# Patient Record
Sex: Male | Born: 1990
Health system: Southern US, Community
[De-identification: ages and names within clinical notes are randomized; demographics above are authoritative.]

## PROBLEM LIST (undated history)

## (undated) DIAGNOSIS — B2790 Infectious mononucleosis, unspecified without complication: Secondary | ICD-10-CM

## (undated) DIAGNOSIS — Z8709 Personal history of other diseases of the respiratory system: Secondary | ICD-10-CM

## (undated) DIAGNOSIS — K529 Noninfective gastroenteritis and colitis, unspecified: Secondary | ICD-10-CM

## (undated) DIAGNOSIS — J329 Chronic sinusitis, unspecified: Secondary | ICD-10-CM

## (undated) DIAGNOSIS — J302 Other seasonal allergic rhinitis: Secondary | ICD-10-CM

## (undated) HISTORY — DX: Noninfective gastroenteritis and colitis, unspecified: K52.9

## (undated) HISTORY — DX: Infectious mononucleosis, unspecified without complication: B27.90

## (undated) HISTORY — DX: Other seasonal allergic rhinitis: J30.2

## (undated) HISTORY — DX: Personal history of other diseases of the respiratory system: Z87.09

## (undated) HISTORY — DX: Chronic sinusitis, unspecified: J32.9

## (undated) HISTORY — PX: HERNIA REPAIR: SHX51

---
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
## 2011-02-03 ENCOUNTER — Ambulatory Visit (INDEPENDENT_AMBULATORY_CARE_PROVIDER_SITE_OTHER): Payer: PRIVATE HEALTH INSURANCE | Admitting: Family Medicine

## 2011-02-03 ENCOUNTER — Encounter: Payer: Self-pay | Admitting: Family Medicine

## 2011-02-03 DIAGNOSIS — M25569 Pain in unspecified knee: Secondary | ICD-10-CM

## 2011-02-03 DIAGNOSIS — Z23 Encounter for immunization: Secondary | ICD-10-CM

## 2011-02-03 DIAGNOSIS — J45909 Unspecified asthma, uncomplicated: Secondary | ICD-10-CM

## 2011-02-03 DIAGNOSIS — J301 Allergic rhinitis due to pollen: Secondary | ICD-10-CM

## 2011-02-11 NOTE — Assessment & Plan Note (Signed)
Summary: med refill [facility]   Vital Signs:  Patient profile:   20 year old male Weight:      135.25 pounds Temp:     98.2 degrees F oral Pulse rate:   60 / minute Pulse rhythm:   regular BP sitting:   118 / 72  (left arm) Cuff size:   regular  Vitals Entered By: Selena Batten Dance CMA Duncan Dull) (February 03, 2011 8:07 AM) CC: Med refill and Right knee pain Comments Wants 3 month suplly of Singulair x1 year for mail order   History of Present Illness: CC: med refil, R knee.  R knee injury - slammed in car door about 5 years ago, made popping sound.  recently at work and skating hit it into metal pole, swollen x 2 wks, pain continued.  Hurts on sides.  Knee pops, sometimes gives out.  No problems with left knee.    singulair to be refilled.  3 mo supply.  uses for asthma but helps with allergies as well.  notices difference when off singulair.  notices has more flares of asthma.  Current Medications (verified): 1)  Singulair 10 Mg Tabs (Montelukast Sodium) .Marland Kitchen.. 1 By Mouth Once Daily 2)  Claritin 10 Mg Tabs (Loratadine) .Marland Kitchen.. 1 By Mouth Once Daily 3)  Proventil Hfa 108 (90 Base) Mcg/act Aers (Albuterol Sulfate) .... As Directed As Needed 4)  Excedrin Migraine 250-250-65 Mg Tabs (Aspirin-Acetaminophen-Caffeine) .... As Needed  Allergies (verified): No Known Drug Allergies  Past History:  Past Medical History: Last updated: 11/26/2010 seasonal allergies recurrent sinusitis asthma  Social History: Last updated: 11/26/2010 No smoking, no EtOH, no rec drugs Lives with dad, sister, 2 cats, 1 dog [employer], 12th grade, unsure what he wants to do yet. Mostly A/Bs. with girlfriend but not sexually active  Review of Systems       per [facility]  Physical Exam  General:  Well appearing adolescent,no acute distress Head:  normocephalic and atraumatic  Eyes:  PERRL, EOMI Ears:  TM's pearly gray with normal light reflex and landmarks, canals clear  Nose:  Clear without  Rhinorrhea Mouth:  Clear without erythema, edema or exudate, mucous membranes moist Neck:  supple without adenopathy  Lungs:  Clear to ausc, no crackles, rhonchi or wheezing, no grunting, flaring or retractions  Heart:  RRR without murmur  Msk:  L knee nontender, no deformity. R knee tender to palpation medial joint line and posterior medial, mild crepitus, no deformity or erythema.  neg drawer sign, no PFgrind, FROM flexion/extension, neg McMurray's Pulses:  2+ rad pulses   Impression & Recommendations:  Problem # 1:  ASTHMA (ICD-493.90) refilled singulair x 1 year, mail order so printed out scripts.  His updated medication list for this problem includes:    Singulair 10 Mg Tabs (Montelukast sodium) .Marland Kitchen... 1 by mouth once daily    Proventil Hfa 108 (90 Base) Mcg/act Aers (Albuterol sulfate) .Marland Kitchen... As directed as needed  Problem # 2:  ALLERGIC RHINITIS, SEASONAL (ICD-477.0) see above  Problem # 3:  KNEE PAIN, RIGHT (ICD-719.46) Assessment: New anticipate pes anserine bursitis.  treat with ice, NSAIDs, compression (rec neoprene sleeve), and provided with stretching exercises from [facility] patient advisor.  if not better, return in 3-4 wks for f/u, get imaging, consider PT vs [facility] referral.  His updated medication list for this problem includes:    Excedrin Migraine 250-250-65 Mg Tabs (Aspirin-acetaminophen-caffeine) .Marland Kitchen... As needed    Naprosyn 500 Mg Tabs (Naproxen) .Marland Kitchen... Take one twice daily with food  for 7 days then as needed  Problem # 4:  Preventive Health Care (ICD-V70.0) meningitis shot today, due for 2nd varicella then will be UTD.  Complete Medication List: 1)  Singulair 10 Mg Tabs (Montelukast sodium) .Marland Kitchen.. 1 by mouth once daily 2)  Claritin 10 Mg Tabs (Loratadine) .Marland Kitchen.. 1 by mouth once daily 3)  Proventil Hfa 108 (90 Base) Mcg/act Aers (Albuterol sulfate) .... As directed as needed 4)  Excedrin Migraine 250-250-65 Mg Tabs (Aspirin-acetaminophen-caffeine) .... As needed 5)   Naprosyn 500 Mg Tabs (Naproxen) .... Take one twice daily with food for 7 days then as needed  Other Orders: Meningococcal Vaccine Comfort (16109) Admin 1st Vaccine (60454)  Patient Instructions: 1)  ice to knee daily for no more than 15 min at a time. 2)  use anti inflammatory prescribed, with food.  take daily for 5-7 days then as needed. 3)  stretching exercises provided. 4)  knee brace for extra support.  5)  If not better with these measures, please return to be seen again in 3-4 wks. 6)  call us with questions. 7)  Meningitis shot given today. Prescriptions: NAPROSYN 500 MG TABS (NAPROXEN) take one twice daily with food for 7 days then as needed  #45 x 0   Entered and Authorized by:   Eustaquio Boyden  MD   Signed by:   Eustaquio Boyden  MD on 02/03/2011   Method used:   Electronically to        [facility] [phone]* (retail)       [address] [place], [place]  [address]       Ph: [phone]       Fax: [phone]   RxID:   [id number] SINGULAIR 10 MG TABS (MONTELUKAST SODIUM) 1 by mouth once daily  #90 x 3   Entered and Authorized by:   Eustaquio Boyden  MD   Signed by:   Eustaquio Boyden  MD on 02/03/2011   Method used:   Print then Give to Patient   RxID:   [id number] SINGULAIR 10 MG TABS (MONTELUKAST SODIUM) 1 by mouth once daily  #90 x 3   Entered and Authorized by:   Eustaquio Boyden  MD   Signed by:   Eustaquio Boyden  MD on 02/03/2011   Method used:   Electronically to        [facility] [phone]* (retail)       [address] [place], [place]  [address]       Ph: [phone]       Fax: [phone]   RxID:   [id number]    Orders Added: 1)  Meningococcal Vaccine Veyo [90733] 2)  Admin 1st Vaccine [90471] 3)  Est. Patient Level III [62831]   Immunizations Administered:  Meningococcal Vaccine:    Vaccine Type: Meningococcal    Site: left deltoid    Mfr: Sanofi Pasteur     Dose: 0.5 ml    Route: IM    Given by: Selena Batten Dance CMA (AAMA)    Exp. Date: 12/12/2011    Lot #: [id number]    VIS given: 12/21/06 version given February 03, 2011.   Immunizations Administered:  Meningococcal Vaccine:    Vaccine Type: Meningococcal    Site: left deltoid    Mfr: [employer]  Dose: 0.5 ml    Route: IM    Given by: Janee Morn CMA (AAMA)    Exp. Date: 12/12/2011    Lot #: [id number]    VIS given: 12/21/06 version given February 03, 2011.  Current Allergies (reviewed today): No known allergies

## 2011-04-07 ENCOUNTER — Encounter: Payer: Self-pay | Admitting: Family Medicine

## 2011-04-07 ENCOUNTER — Ambulatory Visit (INDEPENDENT_AMBULATORY_CARE_PROVIDER_SITE_OTHER): Payer: PRIVATE HEALTH INSURANCE | Admitting: Family Medicine

## 2011-04-07 ENCOUNTER — Encounter: Payer: Self-pay | Admitting: *Deleted

## 2011-04-07 VITALS — BP 122/76 | HR 68 | Temp 98.7°F | Wt 135.0 lb

## 2011-04-07 DIAGNOSIS — R5381 Other malaise: Secondary | ICD-10-CM

## 2011-04-07 DIAGNOSIS — R634 Abnormal weight loss: Secondary | ICD-10-CM | POA: Insufficient documentation

## 2011-04-07 DIAGNOSIS — R1031 Right lower quadrant pain: Secondary | ICD-10-CM

## 2011-04-07 DIAGNOSIS — R5383 Other fatigue: Secondary | ICD-10-CM | POA: Insufficient documentation

## 2011-04-07 NOTE — Assessment & Plan Note (Addendum)
Check basic blood work.  Discussed healthy eating and sleeping, discussed goal / healthy day. Check mono screen given recent viral infection with sore throat.

## 2011-04-07 NOTE — Assessment & Plan Note (Signed)
See above.  On our scales, stable since 10/2010.   Discussed healthy eating.  Some shotty inguinal LAD.  Check basic blood work to r/o fatigue or other cause of weight loss. If not improving, further evaluation.

## 2011-04-07 NOTE — Progress Notes (Addendum)
  Subjective:    Patient ID: Shane Garza, male    DOB: February 16, 1991, 20 y.o.   MRN: [id number]  HPI CC: fatigue  Trayven presents today to discuss several things.  Goes to sleep tired, fatigued, wakes up the same way.  Goes to bed around 10pm, falls asleep around 10:30 to 11pm.  Wakes up around 5:45am.  Also h/o sleep walking.  Noticing losing weight. Usually size 30, currently using size 28 and has to tighten belt.  Decreased energy.  Also found 2 ticks on him last week.  More stressed than usual.  Graduation, parents, gets aggravated.  Work going good.  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  Sometimes has pain RLQ radiating to groin.  Off and on for last several months, this episode going on for 2 wks.  No flank pain.  Not trying anything for pain.  Pain worse with rolling to right.  ? If hernia.  H/o hernia repair on L (as 20yo).  Appetite ok.  Mild anhedonia.  No SI/HI.  No sick contacts at home.  No recent travel outside of country.  No fever/chills.  No nausea/vomiting/diarrhea.  No chest pain, SOB.  Currently with ST.  No h/o mono.  Thinks may be getting congested - pressure in head.  Denies sexual activity, dysuria, urgency, urethral discharge.  Gave blood 1 mo ago.  No recent blood work.  Worried about weight loss, states other ppl have brought it up.  However at our scales stable weight. Wt Readings from Last 3 Encounters:  04/07/11 135 lb (61.236 kg) (24.29%)  02/03/11 135 lb 4 oz (61.349 kg) (25.85%)  11/13/10 143 lb 4 oz (64.978 kg) (41.37%)   Review of Systems Per HPI    Objective:   Physical Exam  Nursing note and vitals reviewed. Constitutional: He appears well-developed and well-nourished. No distress.  HENT:  Head: Normocephalic and atraumatic.  Right Ear: External ear normal.  Left Ear: External ear normal.  Mouth/Throat: Oropharynx is clear and moist. No oropharyngeal exudate.  Eyes: Conjunctivae and EOM are normal. Pupils are equal, round, and  reactive to light. No scleral icterus.  Neck: Normal range of motion. Neck supple. No thyromegaly present.  Cardiovascular: Normal rate, regular rhythm, normal heart sounds and intact distal pulses.   No murmur heard. Pulmonary/Chest: Effort normal and breath sounds normal. No respiratory distress. He has no wheezes. He has no rales.  Abdominal: Soft. Bowel sounds are normal. He exhibits no distension and no mass. There is tenderness (mild RLQ tenderness, longstanding). There is no rebound and no guarding. Hernia confirmed negative in the right inguinal area (tender to palpation at entrance to inguinal ring, tender with cough but no hernia appreciated) and confirmed negative in the left inguinal area.  Genitourinary: Testes normal and penis normal. Right testis shows no mass, no swelling and no tenderness. Right testis is descended. Left testis shows no mass, no swelling and no tenderness. Left testis is descended.       bilateral shotty inguinal LAD. No significant appreciable inguinal hernias, + tender on R with testing hernia.  Lymphadenopathy:    He has no cervical adenopathy.  Skin: Skin is warm and dry. No rash noted.  Psychiatric: He has a normal mood and affect.          Assessment & Plan:

## 2011-04-07 NOTE — Assessment & Plan Note (Signed)
Unclear etiology, longstanding, for >6 mo. Duration not consistent with appendiceal inflammation. ?inguinal hernia on R as story consistent with this, although did not appreciate on exam today.  s/p repair on L as child.   If not resolving, likely referral to surgery for evaluation.

## 2011-04-07 NOTE — Patient Instructions (Signed)
For weight - we will keep watching this.  Return in 2-3 months for follow up, sooner if feeling worse.  Ensure getting 3 healthy meals a day.  Goal is 7-8 hours of sleep/day. For abdominal pain, we will check some blood work today.  We will call you with results.  If continued, may send you to surgery for evaluation. Good to see you today, call us with questions.

## 2011-04-08 LAB — CBC WITH DIFFERENTIAL/PLATELET
Basophils Absolute: 0 10*3/uL (ref 0.0–0.1)
Basophils Relative: 0.3 % (ref 0.0–3.0)
Eosinophils Absolute: 0.1 10*3/uL (ref 0.0–0.7)
Eosinophils Relative: 1.5 % (ref 0.0–5.0)
HCT: 45.2 % (ref 39.0–52.0)
Hemoglobin: 15.6 g/dL (ref 13.0–17.0)
Lymphocytes Relative: 26.3 % (ref 12.0–46.0)
Lymphs Abs: 1.7 10*3/uL (ref 0.7–4.0)
MCHC: 34.5 g/dL (ref 30.0–36.0)
MCV: 94.6 fl (ref 78.0–100.0)
Monocytes Absolute: 0.5 10*3/uL (ref 0.1–1.0)
Monocytes Relative: 8.5 % (ref 3.0–12.0)
Neutro Abs: 4 10*3/uL (ref 1.4–7.7)
Neutrophils Relative %: 63.4 % (ref 43.0–77.0)
Platelets: 261 10*3/uL (ref 150.0–400.0)
RBC: 4.78 Mil/uL (ref 4.22–5.81)
RDW: 13.2 % (ref 11.5–14.6)
WBC: 6.4 10*3/uL (ref 4.5–10.5)

## 2011-04-08 LAB — COMPREHENSIVE METABOLIC PANEL
ALT: 15 U/L (ref 0–53)
AST: 16 U/L (ref 0–37)
Albumin: 4.9 g/dL (ref 3.5–5.2)
Alkaline Phosphatase: 90 U/L (ref 39–117)
BUN: 12 mg/dL (ref 6–23)
CO2: 26 mEq/L (ref 19–32)
Calcium: 10 mg/dL (ref 8.4–10.5)
Chloride: 104 mEq/L (ref 96–112)
Creatinine, Ser: 0.9 mg/dL (ref 0.4–1.5)
GFR: 122.51 mL/min (ref >60.00)
Glucose, Bld: 61 mg/dL — ABNORMAL LOW (ref 70–99)
Potassium: 4.7 mEq/L (ref 3.5–5.1)
Sodium: 142 mEq/L (ref 135–145)
Total Bilirubin: 0.7 mg/dL (ref 0.3–1.2)
Total Protein: 7.9 g/dL (ref 6.0–8.3)

## 2011-04-08 LAB — MONONUCLEOSIS SCREEN: Mono Screen: NEGATIVE

## 2011-04-08 LAB — TSH: TSH: 1.83 u[IU]/mL (ref 0.35–5.50)

## 2011-07-02 ENCOUNTER — Encounter: Payer: Self-pay | Admitting: Family Medicine

## 2011-07-02 ENCOUNTER — Ambulatory Visit (INDEPENDENT_AMBULATORY_CARE_PROVIDER_SITE_OTHER): Payer: PRIVATE HEALTH INSURANCE | Admitting: Family Medicine

## 2011-07-02 VITALS — BP 118/70 | HR 60 | Temp 97.9°F | Ht 64.25 in | Wt 135.2 lb

## 2011-07-02 DIAGNOSIS — J45909 Unspecified asthma, uncomplicated: Secondary | ICD-10-CM

## 2011-07-02 DIAGNOSIS — Z0000 Encounter for general adult medical examination without abnormal findings: Secondary | ICD-10-CM | POA: Insufficient documentation

## 2011-07-02 DIAGNOSIS — J301 Allergic rhinitis due to pollen: Secondary | ICD-10-CM

## 2011-07-02 DIAGNOSIS — R634 Abnormal weight loss: Secondary | ICD-10-CM

## 2011-07-02 DIAGNOSIS — Z020 Encounter for examination for admission to educational institution: Secondary | ICD-10-CM

## 2011-07-02 DIAGNOSIS — Z23 Encounter for immunization: Secondary | ICD-10-CM

## 2011-07-02 DIAGNOSIS — Z0289 Encounter for other administrative examinations: Secondary | ICD-10-CM

## 2011-07-02 LAB — POCT URINALYSIS DIPSTICK
Bilirubin, UA: NEGATIVE
Blood, UA: NEGATIVE
Glucose, UA: NEGATIVE
Ketones, UA: NEGATIVE
Leukocytes, UA: NEGATIVE
Nitrite, UA: NEGATIVE
Protein, UA: NEGATIVE
Spec Grav, UA: 1.025
Urobilinogen, UA: 0.2
pH, UA: 6

## 2011-07-02 MED ORDER — TUBERCULIN PPD 5 UNIT/0.1ML ID SOLN: 5.0000 [IU] | Freq: Once | INTRADERMAL | Status: DC

## 2011-07-02 NOTE — Assessment & Plan Note (Signed)
Stable on singulair and claritin.  

## 2011-07-02 NOTE — Patient Instructions (Signed)
Tdap today as well as 2nd varicella. PPD today - return Friday with form to finish filling out. Good luck at school! Keep home and car smoke-free Stay physically active (>30-60 minutes 3 times a day) Maximum 1-2 hours of TV & computer a day Wear seatbelts, ensure passengers do too Drive responsibly when you get your license Avoid alcohol, smoking, drug use Ride with designated driver or call for a ride if drinking Abstinence from sex is the best way to avoid pregnancy and STDs Limit sun, use sunscreen Seek help if you feel angry, depressed, or sad often 3 meals a day and healthy snacks Brush teeth twice a day Participate in social activities, sports, community groups Maintain strong family relationships Follow up in 1 year or as needed

## 2011-07-02 NOTE — Progress Notes (Addendum)
Subjective:    Patient ID: Shane Garza, male    DOB: 07-25-1991, 20 y.o.   MRN: [id number]  HPI CC: CPE  ?poison ivy - right forearm.  Was into some insulation over weekend.  Also mowing yard.  Using benadryl anti itch gel.  Running daily, going to gym. 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 Passed agility and physical stamina last week. Sunscreen, 100% seatbelt. Healthy diet - fruits, vegetables, water.  No multivitamin. Good relations with family, friends. Not sexually active.  H/o asthma, mild intermittent, no recent flares. H/o allergies well controlled on singulair and claritin.  Medications and allergies reviewed and updated in chart. Patient Active Problem List  Diagnoses  . ALLERGIC RHINITIS, SEASONAL  . ASTHMA  . HEADACHE  . RLQ abdominal pain  . Fatigue  . Weight loss, non-intentional   Past Medical History  Diagnosis Date  . Allergy   . Recurrent sinusitis   . Asthma     mild intermittent, no recent flares   Past Surgical History  Procedure Date  . Hernia repair    History  Substance Use Topics  . Smoking status: Never Smoker   . Smokeless tobacco: Not on file  . Alcohol Use: No   Family History  Problem Relation Age of Onset  . Hypertension Father   . Diabetes Paternal Grandfather    No Known Allergies Current Outpatient Prescriptions on File Prior to Visit  Medication Sig Dispense Refill  . albuterol (PROVENTIL HFA) 108 (90 BASE) MCG/ACT inhaler Inhale 2 puffs into the lungs every 6 (six) hours as needed.        Marland Kitchen aspirin-acetaminophen-caffeine (EXCEDRIN MIGRAINE) 250-250-65 MG per tablet Take 1 tablet by mouth every 6 (six) hours as needed.        . loratadine (CLARITIN) 10 MG tablet Take 10 mg by mouth daily.        . montelukast (SINGULAIR) 10 MG tablet Take 10 mg by mouth at bedtime.        . naproxen (NAPROSYN) 500 MG tablet Take 500 mg by mouth 2 (two) times daily with a meal. For 7 days only         Review of Systems  Constitutional: Negative for fever, chills, activity change, appetite change, fatigue and unexpected weight change.  HENT: Negative for hearing loss and neck pain.   Eyes: Negative for visual disturbance.  Respiratory: Negative for cough, choking, chest tightness, shortness of breath and wheezing.   Cardiovascular: Negative for chest pain, palpitations and leg swelling.  Gastrointestinal: Negative for nausea, vomiting, abdominal pain, diarrhea, constipation, blood in stool and abdominal distention.  Genitourinary: Negative for hematuria and difficulty urinating.  Musculoskeletal: Negative for myalgias and arthralgias.  Skin: Negative for rash.  Neurological: Negative for dizziness, seizures, syncope and headaches.  Hematological: Does not bruise/bleed easily.  Psychiatric/Behavioral: Negative for dysphoric mood. The patient is not nervous/anxious.        Objective:   Physical Exam  Nursing note and vitals reviewed. Constitutional: He is oriented to person, place, and time. He appears well-developed and well-nourished. No distress.  HENT:  Head: Normocephalic and atraumatic.  Right Ear: External ear normal.  Left Ear: External ear normal.  Nose: Nose normal.  Mouth/Throat: Oropharynx is clear and moist.  Eyes: Conjunctivae and EOM are normal. Pupils are equal, round, and reactive to light.  Neck: Normal range of motion. Neck supple. No thyromegaly present.  Cardiovascular: Normal rate, regular rhythm, normal heart sounds and intact distal pulses.  No murmur heard. Pulses:      Radial pulses are 2+ on the right side, and 2+ on the left side.  Pulmonary/Chest: Effort normal and breath sounds normal. No respiratory distress. He has no wheezes. He has no rales.  Abdominal: Soft. Bowel sounds are normal. He exhibits no distension and no mass. There is no tenderness. There is no rebound and no guarding.  Musculoskeletal: Normal range of motion.       Right  shoulder: Normal.       Left shoulder: Normal.       Right elbow: Normal.      Left elbow: Normal.       Right hip: Normal.       Left hip: Normal.       Right knee: Normal.       Left knee: Normal.       Right ankle: Normal.       Left ankle: Normal.  Lymphadenopathy:    He has no cervical adenopathy.  Neurological: He is alert and oriented to person, place, and time. He has normal strength. No sensory deficit.  Reflex Scores:      Bicep reflexes are 1+ on the right side and 1+ on the left side.      Brachioradialis reflexes are 1+ on the right side and 1+ on the left side.      Patellar reflexes are 2+ on the right side and 2+ on the left side.      Achilles reflexes are 2+ on the right side and 2+ on the left side.      CN grossly intact, station and gait intact  Skin: Skin is warm and dry. Rash (L forearm with mild erythematous papular rash with excoriation) noted.  Psychiatric: He has a normal mood and affect. His behavior is normal. Judgment and thought content normal.          Assessment & Plan:

## 2011-07-02 NOTE — Assessment & Plan Note (Signed)
Minimal sxs, no recent need for albuterol

## 2011-07-02 NOTE — Assessment & Plan Note (Addendum)
Reviewed and filled out forms. Never had chicken pox.  Only 1st varicella in records. Provided with Tdap, 2nd varicella today. PPD placed today. Discussed healthy living, healthy diet. For forearm rash - likely poison ivy, continue benadryl cream and may use OTC HC.  Update if spreading.

## 2011-07-02 NOTE — Assessment & Plan Note (Signed)
Wt Readings from Last 3 Encounters:  07/02/11 135 lb 4 oz (61.349 kg) (23.25%)  04/07/11 135 lb (61.236 kg) (24.29%)  02/03/11 135 lb 4 oz (61.349 kg) (25.85%)

## 2011-07-04 ENCOUNTER — Ambulatory Visit: Payer: PRIVATE HEALTH INSURANCE

## 2011-08-25 ENCOUNTER — Telehealth: Payer: Self-pay | Admitting: *Deleted

## 2011-08-25 NOTE — Telephone Encounter (Signed)
Pt complains of vomiting this morning.  No fever, no diarrhea. Advised him to avoid solid foods, drink small frequent sips of clear fluids, ice chips.  Let us know if he develops severe abd pain or fever.

## 2011-08-25 NOTE — Telephone Encounter (Signed)
Patients father notified.

## 2011-08-25 NOTE — Telephone Encounter (Signed)
Agree. Thanks.  Please ensure no blood in emesis and ask if still taking naprosyn, if so to stop.

## 2011-08-25 NOTE — Telephone Encounter (Signed)
Message left advising patient to return my call and advise if there was any bloody emesis. Advised to stop taking naprosyn if he was still taking it. Will await return call.

## 2011-09-02 ENCOUNTER — Ambulatory Visit (INDEPENDENT_AMBULATORY_CARE_PROVIDER_SITE_OTHER): Payer: PRIVATE HEALTH INSURANCE | Admitting: Family Medicine

## 2011-09-02 ENCOUNTER — Encounter: Payer: Self-pay | Admitting: Family Medicine

## 2011-09-02 VITALS — BP 98/60 | HR 68 | Temp 98.6°F | Wt 132.5 lb

## 2011-09-02 DIAGNOSIS — J45901 Unspecified asthma with (acute) exacerbation: Secondary | ICD-10-CM

## 2011-09-02 DIAGNOSIS — J069 Acute upper respiratory infection, unspecified: Secondary | ICD-10-CM

## 2011-09-02 MED ORDER — AZITHROMYCIN 250 MG PO TABS: ORAL_TABLET | ORAL | Status: AC

## 2011-09-02 NOTE — Patient Instructions (Signed)
Take all antibiotics  Can use albuterol 2 puffs up to every 4 hours if chest is tight, wheezing, or you feel short of breath.  Any kind of cough medicine is fine.

## 2011-09-02 NOTE — Progress Notes (Signed)
  Subjective:    Patient ID: Shane Garza, male    DOB: 1991-01-10, 20 y.o.   MRN: [id number]  HPI  Shane Garza, a 20 y.o. male presents today in the office for the following:    A couple of days ago - has had some congestion. Yesterday, was sitting in class, felt flushed maybe feverish. Got home and felt pretty awful. Fever has broken. Crashed in the bed until this morning. Chest has been hurting.  Some trouble wheezing.   H/o asthma  The PMH, PSH, Social History, Family History, Medications, and allergies have been reviewed in [facility], and have been updated if relevant.   Review of Systems ROS: GEN: Acute illness details above GI: Tolerating PO intake GU: maintaining adequate hydration and urination Pulm: No SOB Interactive and getting along well at home.  Otherwise, ROS is as per the HPI.     Objective:   Physical Exam   Physical Exam  Blood pressure 98/60, pulse 68, temperature 98.6 F (37 C), temperature source Oral, weight 132 lb 8 oz (60.102 kg), SpO2 100.00%.  GEN: A and O x 3. WDWN. NAD.    ENT: Nose clear, ext NML.  No LAD.  No JVD.  TM's clear. Oropharynx clear.  PULM: Normal WOB, no distress. No crackles, wheezes, rhonchi. Mild decreased bs CV: RRR, no M/G/R, No rubs, No JVD.   ABD: S, NT, ND, + BS. No rebound. No guarding. No HSM.   EXT: warm and well-perfused, No c/c/e. PSYCH: Pleasant and conversant.       Assessment & Plan:   1. Asthma exacerbation  azithromycin (ZITHROMAX Z-PAK) 250 MG tablet  2. URI (upper respiratory infection)      Stable, mild asthma exac. Albuterol q 4 hours prn and zpak

## 2011-09-03 ENCOUNTER — Other Ambulatory Visit: Payer: Self-pay | Admitting: *Deleted

## 2011-09-03 MED ORDER — ALBUTEROL SULFATE (2.5 MG/3ML) 0.083% IN NEBU: 2.5000 mg | INHALATION_SOLUTION | Freq: Four times a day (QID) | RESPIRATORY_TRACT | Status: DC | PRN

## 2011-10-24 ENCOUNTER — Telehealth: Payer: Self-pay

## 2011-10-24 NOTE — Telephone Encounter (Signed)
Message left notifying patient's father that patient was up to date on Hep B vaccine and could pick up a copy of that record anytime. I also advised that I would schedule him a flu vaccine next week. I scheduled vaccine for 10-30-11 at 8:30 and told him to call me back if that time was not convenient.

## 2011-10-24 NOTE — Telephone Encounter (Signed)
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 According to immunization record pt had Hep B 01[birth year]03[birth year]93,03[birth year]08[birth year]93 and 08[birth year]18/97. Does pt need Hep B and can test be scheduled? Mr Mccutchan can be reached at [phone].Please advise.

## 2011-10-24 NOTE — Telephone Encounter (Signed)
Done with Hep B.  May come in for flu shot. May provide with immunization record.

## 2011-10-30 ENCOUNTER — Ambulatory Visit (INDEPENDENT_AMBULATORY_CARE_PROVIDER_SITE_OTHER): Payer: PRIVATE HEALTH INSURANCE

## 2011-10-30 DIAGNOSIS — Z23 Encounter for immunization: Secondary | ICD-10-CM

## 2011-12-31 ENCOUNTER — Telehealth: Payer: Self-pay | Admitting: Family Medicine

## 2012-01-01 NOTE — Telephone Encounter (Signed)
Patient came in regarding vaccine record for school. Tried to get things taken care of for him. He will call with anymore problems.

## 2012-05-12 ENCOUNTER — Telehealth: Payer: Self-pay | Admitting: *Deleted

## 2012-05-12 NOTE — Telephone Encounter (Signed)
I'm not sure. He told Lyla Son it specifically said a PFT. I will leave appt as scheduled.

## 2012-05-12 NOTE — Telephone Encounter (Signed)
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 He specifically said they required a PFT. Can you refer him to pulmo for that or do you need to see him prior? He is on the schedule for tomorrow, but I can cancel the appt if you can just do the referral.

## 2012-05-12 NOTE — Telephone Encounter (Signed)
Ask him to bring in form to fill out.  I wonder if in office spirometry with pre/post albuterol will suffice.

## 2012-05-13 ENCOUNTER — Encounter: Payer: Self-pay | Admitting: Family Medicine

## 2012-05-13 ENCOUNTER — Ambulatory Visit (INDEPENDENT_AMBULATORY_CARE_PROVIDER_SITE_OTHER): Payer: PRIVATE HEALTH INSURANCE | Admitting: Family Medicine

## 2012-05-13 VITALS — BP 141/88 | HR 81 | Temp 98.4°F | Wt 139.0 lb

## 2012-05-13 DIAGNOSIS — R030 Elevated blood-pressure reading, without diagnosis of hypertension: Secondary | ICD-10-CM

## 2012-05-13 DIAGNOSIS — J45909 Unspecified asthma, uncomplicated: Secondary | ICD-10-CM

## 2012-05-13 MED ORDER — ALBUTEROL SULFATE (2.5 MG/3ML) 0.083% IN NEBU
2.5000 mg | INHALATION_SOLUTION | Freq: Once | RESPIRATORY_TRACT | Status: AC
  Administered 2012-05-13: 2.5 mg via RESPIRATORY_TRACT

## 2012-05-13 NOTE — Progress Notes (Signed)
Subjective:    Patient ID: Shane Garza, male    DOB: 09-02-1991, 21 y.o.   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  Long hx childhood asthma, but never a problem.  No flares in years.  Hasn't used albuterol inhaler in 4 years, nebulizer either.  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  No h/o HTN in past. Actually always very well controlled. Endorses recently taking NSAIDs, high salt diet, and using smokeless tobacco. BP Readings from Last 3 Encounters:  05/13/12 141/88  09/02/11 98/60  07/02/11 118/70     Medications and allergies reviewed and updated in chart.  Past histories reviewed and updated if relevant as below. Patient Active Problem List  Diagnosis  . ALLERGIC RHINITIS, SEASONAL  . ASTHMA  . HEADACHE  . RLQ abdominal pain  . Fatigue  . Healthcare maintenance   Past Medical History  Diagnosis Date  . Allergy   . Recurrent sinusitis   . Asthma     childhood   Past Surgical History  Procedure Date  . Hernia repair    History  Substance Use Topics  . Smoking status: Former Smoker    Types: Cigarettes  . Smokeless tobacco: Current User    Types: Snuff, Chew  . Alcohol Use: Yes   Family History  Problem Relation Age of Onset  . Hypertension Father   . Diabetes Paternal Grandfather    No Known Allergies Current Outpatient Prescriptions on File Prior to Visit  Medication Sig Dispense Refill  . aspirin-acetaminophen-caffeine (EXCEDRIN MIGRAINE) 250-250-65 MG per tablet Take 1 tablet by mouth every 6 (six) hours as needed.        . loratadine (CLARITIN) 10 MG tablet Take 10 mg by mouth daily.        Marland Kitchen albuterol (PROVENTIL HFA) 108 (90 BASE) MCG/ACT inhaler Inhale 2 puffs into the lungs every 6 (six) hours as needed.        . montelukast (SINGULAIR) 10 MG tablet Take 10 mg by mouth at  bedtime.        Marland Kitchen DISCONTD: albuterol (PROVENTIL) (2.5 MG/3ML) 0.083% nebulizer solution Take 3 mLs (2.5 mg total) by nebulization every 6 (six) hours as needed for wheezing.  75 mL  12   Current Facility-Administered Medications on File Prior to Visit  Medication Dose Route Frequency Provider Last Rate Last Dose  . DISCONTD: tuberculin injection 5 Units  5 Units Intradermal Once Eustaquio Boyden, MD         Review of Systems Per HPI    Objective:   Physical Exam  Nursing note and vitals reviewed. Constitutional: He appears well-developed and well-nourished. No distress.  HENT:  Head: Normocephalic and atraumatic.  Mouth/Throat: Oropharynx is clear and moist. No oropharyngeal exudate.  Eyes: Conjunctivae and EOM are normal. Pupils are equal, round, and reactive to light. No scleral icterus.  Neck: Normal range of motion. Neck supple.  Cardiovascular: Normal rate, regular rhythm, normal heart sounds and intact distal pulses.   No murmur heard. Pulmonary/Chest: Effort normal and breath sounds normal. No respiratory distress. He has no wheezes. He has no rales.       Good air movement, no wheeze   Musculoskeletal: He exhibits no edema.  Lymphadenopathy:    He has no cervical adenopathy.  Skin: Skin is warm and dry. No rash noted.  Psychiatric: He  has a normal mood and affect.       Assessment & Plan:

## 2012-05-13 NOTE — Patient Instructions (Addendum)
Breathing test today - overall normal.  childhood asthma should not cause any problems now. Blood pressure staying a bit elevated - return in 1-2 weeks for doctor visit to recheck.  Avoid anti inflammatories, salt, tobacco.

## 2012-05-13 NOTE — Assessment & Plan Note (Signed)
First elevated reading.  Several reasons could be contributing Discussed avoid salt, tobacco, nsaids, return in 2 wks for recheck blood pressure not immediately after exercise.

## 2012-05-13 NOTE — Assessment & Plan Note (Addendum)
Spirometry today - WNL. No evidence of active asthma. Should not affect ability to enlist.

## 2012-06-03 ENCOUNTER — Ambulatory Visit: Payer: PRIVATE HEALTH INSURANCE | Admitting: Family Medicine

## 2012-06-03 DIAGNOSIS — Z0289 Encounter for other administrative examinations: Secondary | ICD-10-CM

## 2012-06-25 ENCOUNTER — Ambulatory Visit: Payer: PRIVATE HEALTH INSURANCE | Admitting: Family Medicine

## 2012-12-28 ENCOUNTER — Ambulatory Visit (INDEPENDENT_AMBULATORY_CARE_PROVIDER_SITE_OTHER): Payer: PRIVATE HEALTH INSURANCE | Admitting: Family Medicine

## 2012-12-28 ENCOUNTER — Encounter: Payer: Self-pay | Admitting: Family Medicine

## 2012-12-28 VITALS — BP 120/64 | HR 76 | Temp 98.2°F | Wt 151.5 lb

## 2012-12-28 DIAGNOSIS — Z23 Encounter for immunization: Secondary | ICD-10-CM

## 2012-12-28 DIAGNOSIS — J301 Allergic rhinitis due to pollen: Secondary | ICD-10-CM

## 2012-12-28 DIAGNOSIS — Z8709 Personal history of other diseases of the respiratory system: Secondary | ICD-10-CM

## 2012-12-28 NOTE — Patient Instructions (Addendum)
Pass by Marion's office for referral to pulmonologist for formal PFTs. Letter provided today. Let us know if questions. Flu shot today.

## 2012-12-28 NOTE — Progress Notes (Signed)
  Subjective:    Patient ID: Shane Garza, male    DOB: [birth year], 22 y.o.   MRN: [id number]  HPI CC: paperwork  Has been trying to get into military.  [employer] called him last week, stating he needs referral to pulm for formal PFTs given h/o childhood asthma.  Needs letter from PCP saying asthma is not currently a problem.    Would like copy of spirometry done 04/2012.  H/o childhood asthma - no flare since 2006.  Has not used albuterol since 2006.  Denies cough, dyspnea, wheezing.  Stays active and runs without any dyspnea endorsed. States has not used singulair regularly since 2008 although last prescribed 2012.  Working at [employer].  Not currently going to school.  Working full time.  Seasonal allergic rhinitis - has not regularly used spiriva in several years, states not using claritin.  Medications and allergies reviewed and updated in chart.  Past histories reviewed and updated if relevant as below. Patient Active Problem List  Diagnosis  . ALLERGIC RHINITIS, SEASONAL  . History of asthma  . HEADACHE  . RLQ abdominal pain  . Fatigue  . Healthcare maintenance  . Elevated blood pressure reading without diagnosis of hypertension   Past Medical History  Diagnosis Date  . Seasonal allergic rhinitis   . Recurrent sinusitis   . History of asthma     childhood   Past Surgical History  Procedure Date  . Hernia repair    History  Substance Use Topics  . Smoking status: Former Smoker    Types: Cigarettes    Quit date: 12/14/2012  . Smokeless tobacco: Current User    Types: Snuff, Chew  . Alcohol Use: Yes   Family History  Problem Relation Age of Onset  . Hypertension Father   . Diabetes Paternal Grandfather    No Known Allergies Current Outpatient Prescriptions on File Prior to Visit  Medication Sig Dispense Refill  . aspirin-acetaminophen-caffeine (EXCEDRIN MIGRAINE) 250-250-65 MG per tablet Take 1 tablet by mouth every 6 (six) hours  as needed.        Marland Kitchen albuterol (PROVENTIL HFA) 108 (90 BASE) MCG/ACT inhaler Inhale 2 puffs into the lungs every 6 (six) hours as needed.        . loratadine (CLARITIN) 10 MG tablet Take 10 mg by mouth daily.        . montelukast (SINGULAIR) 10 MG tablet Take 10 mg by mouth at bedtime.           Review of Systems Per HPI    Objective:   Physical Exam  Nursing note and vitals reviewed. Constitutional: He appears well-developed and well-nourished. No distress.  HENT:  Head: Normocephalic and atraumatic.  Mouth/Throat: Oropharynx is clear and moist. No oropharyngeal exudate.  Eyes: Conjunctivae normal and EOM are normal. Pupils are equal, round, and reactive to light.  Neck: Normal range of motion. Neck supple.  Cardiovascular: Normal rate, regular rhythm, normal heart sounds and intact distal pulses.   No murmur heard. Pulmonary/Chest: Effort normal and breath sounds normal. No respiratory distress. He has no wheezes. He has no rales.  Lymphadenopathy:    He has no cervical adenopathy.      Assessment & Plan:

## 2012-12-29 ENCOUNTER — Encounter: Payer: Self-pay | Admitting: Family Medicine

## 2012-12-29 NOTE — Assessment & Plan Note (Signed)
Will refer to pulm for eval per military request for clearance given h/o childhood asthma.

## 2012-12-29 NOTE — Assessment & Plan Note (Signed)
States has not used claritin or singulair in several years - will remove singulair from med list.

## 2013-01-03 ENCOUNTER — Telehealth: Payer: Self-pay | Admitting: Family Medicine

## 2013-01-03 DIAGNOSIS — J301 Allergic rhinitis due to pollen: Secondary | ICD-10-CM

## 2013-01-03 DIAGNOSIS — Z8709 Personal history of other diseases of the respiratory system: Secondary | ICD-10-CM

## 2013-01-03 NOTE — Telephone Encounter (Signed)
Placed order in chart. plz have him call Dr. Kendrick Fries to schedule this.

## 2013-01-03 NOTE — Telephone Encounter (Signed)
Pt says he needs PDF (pulmonary breathing test), but does not need an apptmt w/Dr. Kendrick Fries (phone #[phone]; fax # [phone]).  Dr. Ulyses Jarred office says he doesn't need an apptmt w/him, but only needs you to write an order for this test.  Can you write an order so Dr. Kendrick Fries can administer this test? Thank you.

## 2013-01-03 NOTE — Telephone Encounter (Signed)
Patient notified

## 2013-01-11 ENCOUNTER — Institutional Professional Consult (permissible substitution): Payer: PRIVATE HEALTH INSURANCE | Admitting: Pulmonary Disease

## 2013-01-12 ENCOUNTER — Telehealth: Payer: Self-pay

## 2013-01-12 NOTE — Telephone Encounter (Signed)
pts grandmother said pt signed for records on 12/28/12; was sent to [facility] on 01/04/13 and can take 7-10 business days to mail out info once request received. Pts grandmother will ck with [employer] if not received by next week.

## 2013-01-31 ENCOUNTER — Ambulatory Visit (INDEPENDENT_AMBULATORY_CARE_PROVIDER_SITE_OTHER): Payer: PRIVATE HEALTH INSURANCE | Admitting: Internal Medicine

## 2013-01-31 DIAGNOSIS — J301 Allergic rhinitis due to pollen: Secondary | ICD-10-CM

## 2013-01-31 DIAGNOSIS — Z8709 Personal history of other diseases of the respiratory system: Secondary | ICD-10-CM

## 2013-01-31 LAB — PULMONARY FUNCTION TEST

## 2013-01-31 NOTE — Progress Notes (Signed)
PFT done today. 

## 2013-02-04 ENCOUNTER — Telehealth: Payer: Self-pay | Admitting: Pulmonary Disease

## 2013-02-04 NOTE — Telephone Encounter (Signed)
Pt called back and he is aware that we will have to have Dr. Kendrick Fries review these results first.  Pt will have his grandmother call back leave her fax number for these results to be faxed to her.  Pt is aware that Dr. Kendrick Fries will be back in the office on 3/18.  Will hold in triage until grandmother calls back to give her fax number and then will forward to BQ.

## 2013-02-04 NOTE — Telephone Encounter (Signed)
Spoke with pt We did not order the PFT  Dr Sharen Hones ordered this and he will need to go over the results with the pt since he is not est with any of our providers Pt verbalized understanding and states nothing further needed

## 2013-02-04 NOTE — Telephone Encounter (Signed)
ATC patient no answer [id number] 

## 2013-02-04 NOTE — Telephone Encounter (Signed)
The fax number is [phone].  Will need to call prior to faxing so pt's grandmother can turn on the fax machine.  Shane Garza

## 2013-02-06 ENCOUNTER — Telehealth: Payer: Self-pay | Admitting: Family Medicine

## 2013-02-06 NOTE — Telephone Encounter (Signed)
plz notify we received copy of PFTs done by pulm - completely normal.  I've asked to scan into chart. --> asthma not currently an issue. 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

## 2013-02-07 NOTE — Telephone Encounter (Signed)
Faxed as requested after notifying patient's grandmother.

## 2013-02-07 NOTE — Telephone Encounter (Signed)
Message left for patient to return my call.  

## 2013-02-07 NOTE — Telephone Encounter (Signed)
pts grandmother Dale [place] said pt wants PFT faxed to pts grandmother's fax # [phone]. Dale [place] needs a call prior to faxing PFT so pt can turn fax on. Dale Clarkson can be reached at [phone].

## 2013-09-12 ENCOUNTER — Encounter: Payer: Self-pay | Admitting: Family Medicine

## 2013-09-12 ENCOUNTER — Ambulatory Visit (INDEPENDENT_AMBULATORY_CARE_PROVIDER_SITE_OTHER): Payer: PRIVATE HEALTH INSURANCE | Admitting: Family Medicine

## 2013-09-12 VITALS — BP 100/72 | HR 65 | Temp 98.2°F | Ht 64.25 in | Wt 160.2 lb

## 2013-09-12 DIAGNOSIS — M549 Dorsalgia, unspecified: Secondary | ICD-10-CM

## 2013-09-12 MED ORDER — PREDNISONE 20 MG PO TABS: ORAL_TABLET | ORAL | Status: DC

## 2013-09-12 MED ORDER — TIZANIDINE HCL 4 MG PO TABS: 4.0000 mg | ORAL_TABLET | Freq: Every evening | ORAL | Status: DC

## 2013-09-12 MED ORDER — TRAMADOL HCL 50 MG PO TABS: 50.0000 mg | ORAL_TABLET | Freq: Four times a day (QID) | ORAL | Status: DC | PRN

## 2013-09-12 NOTE — Patient Instructions (Signed)
F/u if not better in 4 weeks

## 2013-09-12 NOTE — Progress Notes (Signed)
Patient Name: Shane Garza Date of Birth: 18-Mar-1991 Medical Record Number: [id number] Gender: male  PCP: Eustaquio Boyden, MD  History of Present Illness:  Shane Garza is a 22 y.o. very pleasant male patient who presents with the following: Back Pain  ongoing for approximately: 2-3 days The patient has had back pain before. The back pain is localized into the lumbar spine area. They also describe no radiculopathy.  About 1 year ago, feell on his back at work and had bad back pain. Went to the chiropractor. Nothing like this before.   Going deer hunting over the weekend. On Sunday, it could not get comfortable. Tried to roll over some and got sharp pain at low back Commercial and industrial - crawling, ladders at work.   No numbness or tingling. No bowel or bladder incontinence. No focal weakness. Prior interventions: manipulation Physical therapy: No Acupuncture: No Osteopathic manipulation: No Heat or cold: Minimal effect  Past Medical History, Surgical History, Family History, Medications, Allergies have been reviewed and updated if relevant.  Review of Systems  GEN: No fevers, chills. Nontoxic. Primarily MSK c/o today. MSK: Detailed in the HPI GI: tolerating PO intake without difficulty Neuro: As above  Otherwise the pertinent positives of the ROS are noted above.    Physical Exam  Filed Vitals:   09/12/13 1106  BP: 100/72  Pulse: 65  Temp: 98.2 F (36.8 C)  TempSrc: Oral  Height: 5' 4.25" (1.632 m)  Weight: 160 lb 4 oz (72.689 kg)    Gen: Well-developed,well-nourished,in no acute distress; alert,appropriate and cooperative throughout examination HEENT: Normocephalic and atraumatic without obvious abnormalities.  Ears, externally no deformities Pulm: Breathing comfortably in no respiratory distress Range of motion at  the waist: Flexion, rotation and lateral bending: flexion to 80, ext causes pain, lateral bending normal  No echymosis or  edema Rises to examination table with no difficulty Gait: minimally antalgic  Inspection/Deformity: No abnormality Paraspinus T:  Tight around l4-s1  B Ankle Dorsiflexion (L5,4): 5/5 B Great Toe Dorsiflexion (L5,4): 5/5 Heel Walk (L5): WNL Toe Walk (S1): WNL Rise/Squat (L4): WNL, mild pain  SENSORY B Medial Foot (L4): WNL B Dorsum (L5): WNL B Lateral (S1): WNL Light Touch: WNL Pinprick: WNL  REFLEXES Knee (L4): 2+ Ankle (S1): 2+  B SLR, seated: neg B SLR, supine: neg B FABER: neg B Reverse FABER: neg B Greater Troch: NT B Log Roll: neg B Stork: NT B Sciatic Notch: NT   Assessment and Plan:  Back pain  I reviewed with the patient the structures involved and how they related to diagnosis.   He has been doing NSAIDS already Limit going up and down ladders for now, given work note.   Start with medications, core rehab, and progress from there following low back pain algorithm. No red flags are present.  Patient Instructions  F/u if not better in 4 weeks   Meds ordered this encounter  Medications  . predniSONE (DELTASONE) 20 MG tablet    Sig: 2 tabs po daily for 5 days, then 1 po daily for 5 days    Dispense:  15 tablet    Refill:  0  . tiZANidine (ZANAFLEX) 4 MG tablet    Sig: Take 1 tablet (4 mg total) by mouth Nightly.    Dispense:  30 tablet    Refill:  2  . traMADol (ULTRAM) 50 MG tablet    Sig: Take 1 tablet (50 mg total) by mouth every 6 (six) hours as needed for pain.  Dispense:  50 tablet    Refill:  1

## 2013-11-24 DIAGNOSIS — K529 Noninfective gastroenteritis and colitis, unspecified: Secondary | ICD-10-CM

## 2013-11-24 HISTORY — DX: Noninfective gastroenteritis and colitis, unspecified: K52.9

## 2013-12-07 ENCOUNTER — Encounter: Payer: Self-pay | Admitting: Internal Medicine

## 2013-12-07 ENCOUNTER — Ambulatory Visit (INDEPENDENT_AMBULATORY_CARE_PROVIDER_SITE_OTHER): Payer: Self-pay | Admitting: Internal Medicine

## 2013-12-07 VITALS — BP 118/72 | HR 87 | Temp 98.7°F | Wt 163.0 lb

## 2013-12-07 DIAGNOSIS — J209 Acute bronchitis, unspecified: Secondary | ICD-10-CM

## 2013-12-07 MED ORDER — AZITHROMYCIN 250 MG PO TABS: ORAL_TABLET | ORAL | Status: DC

## 2013-12-07 NOTE — Patient Instructions (Signed)
Acute Bronchitis Bronchitis is inflammation of the airways that extend from the windpipe into the lungs (bronchi). The inflammation often causes mucus to develop. This leads to a cough, which is the most common symptom of bronchitis.  In acute bronchitis, the condition usually develops suddenly and goes away over time, usually in a couple weeks. Smoking, allergies, and asthma can make bronchitis worse. Repeated episodes of bronchitis may cause further lung problems.  CAUSES Acute bronchitis is most often caused by the same virus that causes a cold. The virus can spread from person to person (contagious).  SIGNS AND SYMPTOMS   Cough.   Fever.   Coughing up mucus.   Body aches.   Chest congestion.   Chills.   Shortness of breath.   Sore throat.  DIAGNOSIS  Acute bronchitis is usually diagnosed through a physical exam. Tests, such as chest X-rays, are sometimes done to rule out other conditions.  TREATMENT  Acute bronchitis usually goes away in a couple weeks. Often times, no medical treatment is necessary. Medicines are sometimes given for relief of fever or cough. Antibiotics are usually not needed but may be prescribed in certain situations. In some cases, an inhaler may be recommended to help reduce shortness of breath and control the cough. A cool mist vaporizer may also be used to help thin bronchial secretions and make it easier to clear the chest.  HOME CARE INSTRUCTIONS  Get plenty of rest.   Drink enough fluids to keep your urine clear or pale yellow (unless you have a medical condition that requires fluid restriction). Increasing fluids may help thin your secretions and will prevent dehydration.   Only take over-the-counter or prescription medicines as directed by your health care provider.   Avoid smoking and secondhand smoke. Exposure to cigarette smoke or irritating chemicals will make bronchitis worse. If you are a smoker, consider using nicotine gum or skin  patches to help control withdrawal symptoms. Quitting smoking will help your lungs heal faster.   Reduce the chances of another bout of acute bronchitis by washing your hands frequently, avoiding people with cold symptoms, and trying not to touch your hands to your mouth, nose, or eyes.   Follow up with your health care provider as directed.  SEEK MEDICAL CARE IF: Your symptoms do not improve after 1 week of treatment.  SEEK IMMEDIATE MEDICAL CARE IF:  You develop an increased fever or chills.   You have chest pain.   You have severe shortness of breath.  You have bloody sputum.   You develop dehydration.  You develop fainting.  You develop repeated vomiting.  You develop a severe headache. MAKE SURE YOU:   Understand these instructions.  Will watch your condition.  Will get help right away if you are not doing well or get worse. Document Released: 12/18/2004 Document Revised: 07/13/2013 Document Reviewed: 05/03/2013 Exit[employer] Patient Information 2014 [employer].  

## 2013-12-07 NOTE — Progress Notes (Signed)
Pre-visit discussion using our clinic review tool. No additional management support is needed unless otherwise documented below in the visit note.  

## 2013-12-07 NOTE — Progress Notes (Signed)
HPI  Pt presents to the clinic today with c/o headache, cough, chest congestion, ear fullness and hoarseness. This started about 5 days ago. The cough is productive of thick yellow/brown sputum. He does report fevers up to 101. He has tried Sudafed and cough syrup. He does have a history of allergies and asthma. He has had sick contacts. He does not currently smoke.  Review of Systems      Past Medical History  Diagnosis Date  . Seasonal allergic rhinitis   . Recurrent sinusitis   . History of asthma     childhood    Family History  Problem Relation Age of Onset  . Hypertension Father   . Diabetes Paternal Grandfather     History   Social History  . Marital Status: Single    Spouse Name: N/A    Number of Children: N/A  . Years of Education: N/A   Occupational History  . Not on file.   Social History Main Topics  . Smoking status: Former Smoker    Types: Cigarettes    Quit date: 12/14/2012  . Smokeless tobacco: Current User    Types: Snuff, Chew  . Alcohol Use: Yes  . Drug Use: No  . Sexual Activity: No   Other Topics Concern  . Not on file   Social History Narrative   Lives with dad, sister, 2 cats and 1 dog   To start firefighter academy   Mostly A/B'S    No Known Allergies   Constitutional: Positive headache, fatigue and fever. Denies abrupt weight changes.  HEENT:  Positive sore throat. Denies eye redness, eye pain, pressure behind the eyes, facial pain, nasal congestion, ear pain, ringing in the ears, wax buildup, runny nose or bloody nose. Respiratory: Positive cough. Denies difficulty breathing or shortness of breath.  Cardiovascular: Denies chest pain, chest tightness, palpitations or swelling in the hands or feet.   No other specific complaints in a complete review of systems (except as listed in HPI above).  Objective:   BP 118/72  Pulse 87  Temp(Src) 98.7 F (37.1 C) (Oral)  Wt 163 lb (73.936 kg)  SpO2 98% Wt Readings from Last 3  Encounters:  12/07/13 163 lb (73.936 kg)  09/12/13 160 lb 4 oz (72.689 kg)  12/28/12 151 lb 8 oz (68.72 kg)     General: Appears his stated age, well developed, well nourished in NAD. HEENT: Head: normal shape and size; Eyes: sclera white, no icterus, conjunctiva pink, PERRLA and EOMs intact; Ears: Tm's gray and intact, normal light reflex; Nose: mucosa pink and moist, septum midline; Throat/Mouth: + PND. Teeth present, mucosa erythematous and moist, white patchy exudate noted, no lesions or ulcerations noted.  Neck: Mild cervical lymphadenopathy. Neck supple, trachea midline. No massses, lumps or thyromegaly present.  Cardiovascular: Normal rate and rhythm. S1,S2 noted.  No murmur, rubs or gallops noted. No JVD or BLE edema. No carotid bruits noted. Pulmonary/Chest: Normal effort and positive vesicular breath sounds. No respiratory distress. No wheezes, rales or ronchi noted.      Assessment & Plan:   Acute Bronchitis:  Get some rest and drink plenty of water Do salt water gargles for the sore throat eRx for Azithromax x 5 days Can take tylenol/ibuoprofen OTC as needed for body aches/fever   RTC as needed or if symptoms persist.

## 2014-01-11 ENCOUNTER — Emergency Department: Payer: Self-pay | Admitting: Emergency Medicine

## 2014-01-11 ENCOUNTER — Ambulatory Visit (INDEPENDENT_AMBULATORY_CARE_PROVIDER_SITE_OTHER): Payer: 59 | Admitting: Family Medicine

## 2014-01-11 ENCOUNTER — Encounter: Payer: Self-pay | Admitting: Family Medicine

## 2014-01-11 VITALS — BP 126/80 | HR 100 | Temp 98.3°F | Wt 172.2 lb

## 2014-01-11 DIAGNOSIS — K5289 Other specified noninfective gastroenteritis and colitis: Secondary | ICD-10-CM | POA: Insufficient documentation

## 2014-01-11 DIAGNOSIS — R109 Unspecified abdominal pain: Secondary | ICD-10-CM

## 2014-01-11 LAB — CBC WITH DIFFERENTIAL/PLATELET
Basophil #: 0.1 10*3/uL (ref 0.0–0.1)
Basophil %: 0.8 %
Basophils Absolute: 0 10*3/uL (ref 0.0–0.1)
Basophils Relative: 0.3 % (ref 0.0–3.0)
Eosinophil #: 0.1 10*3/uL (ref 0.0–0.7)
Eosinophil %: 0.6 %
Eosinophils Absolute: 0.1 10*3/uL (ref 0.0–0.7)
Eosinophils Relative: 0.5 % (ref 0.0–5.0)
HCT: 50.1 % (ref 39.0–52.0)
HCT: 47.1 % (ref 40.0–52.0)
HGB: 16.2 g/dL (ref 13.0–18.0)
Hemoglobin: 16.9 g/dL (ref 13.0–17.0)
Lymphocyte #: 2.1 10*3/uL (ref 1.0–3.6)
Lymphocyte %: 13.8 %
Lymphocytes Relative: 14.7 % (ref 12.0–46.0)
Lymphs Abs: 1.9 10*3/uL (ref 0.7–4.0)
MCH: 31.5 pg (ref 26.0–34.0)
MCHC: 33.7 g/dL (ref 30.0–36.0)
MCHC: 34.3 g/dL (ref 32.0–36.0)
MCV: 95 fl (ref 78.0–100.0)
MCV: 92 fL (ref 80–100)
Monocyte #: 1.3 x10 3/mm — ABNORMAL HIGH (ref 0.2–1.0)
Monocyte %: 8.4 %
Monocytes Absolute: 1.2 10*3/uL — ABNORMAL HIGH (ref 0.1–1.0)
Monocytes Relative: 9.3 % (ref 3.0–12.0)
Neutro Abs: 9.5 10*3/uL — ABNORMAL HIGH (ref 1.4–7.7)
Neutrophil #: 11.4 10*3/uL — ABNORMAL HIGH (ref 1.4–6.5)
Neutrophil %: 76.4 %
Neutrophils Relative %: 75.2 % (ref 43.0–77.0)
Platelet: 235 10*3/uL (ref 150–440)
Platelets: 249 10*3/uL (ref 150.0–400.0)
RBC: 5.27 Mil/uL (ref 4.22–5.81)
RBC: 5.13 10*6/uL (ref 4.40–5.90)
RDW: 13.1 % (ref 11.5–14.6)
RDW: 13.2 % (ref 11.5–14.5)
WBC: 12.6 10*3/uL — ABNORMAL HIGH (ref 4.5–10.5)
WBC: 14.9 10*3/uL — ABNORMAL HIGH (ref 3.8–10.6)

## 2014-01-11 LAB — COMPREHENSIVE METABOLIC PANEL
ALT: 24 U/L (ref 0–53)
AST: 21 U/L (ref 0–37)
Albumin: 4.6 g/dL (ref 3.5–5.2)
Albumin: 4.1 g/dL (ref 3.4–5.0)
Alkaline Phosphatase: 81 U/L (ref 39–117)
Alkaline Phosphatase: 105 U/L
Anion Gap: 5 — ABNORMAL LOW (ref 7–16)
BUN: 10 mg/dL (ref 6–23)
BUN: 14 mg/dL (ref 7–18)
Bilirubin,Total: 0.7 mg/dL (ref 0.2–1.0)
CO2: 28 mEq/L (ref 19–32)
Calcium, Total: 9.5 mg/dL (ref 8.5–10.1)
Calcium: 9.9 mg/dL (ref 8.4–10.5)
Chloride: 94 mEq/L — ABNORMAL LOW (ref 96–112)
Chloride: 101 mmol/L (ref 98–107)
Co2: 27 mmol/L (ref 21–32)
Creatinine, Ser: 1 mg/dL (ref 0.4–1.5)
Creatinine: 0.93 mg/dL (ref 0.60–1.30)
EGFR (African American): >60
EGFR (Non-African Amer.): >60
GFR: 101.23 mL/min (ref >60.00)
Glucose, Bld: 84 mg/dL (ref 70–99)
Glucose: 96 mg/dL (ref 65–99)
Osmolality: 267 (ref 275–301)
Potassium: 4.6 mEq/L (ref 3.5–5.1)
Potassium: 4 mmol/L (ref 3.5–5.1)
SGOT(AST): 26 U/L (ref 15–37)
SGPT (ALT): 28 U/L (ref 12–78)
Sodium: 132 mEq/L — ABNORMAL LOW (ref 135–145)
Sodium: 133 mmol/L — ABNORMAL LOW (ref 136–145)
Total Bilirubin: 0.8 mg/dL (ref 0.3–1.2)
Total Protein: 8.5 g/dL — ABNORMAL HIGH (ref 6.0–8.3)
Total Protein: 8.5 g/dL — ABNORMAL HIGH (ref 6.4–8.2)

## 2014-01-11 LAB — POCT URINALYSIS DIPSTICK
Bilirubin, UA: NEGATIVE
Blood, UA: NEGATIVE
Glucose, UA: NEGATIVE
Ketones, UA: NEGATIVE
Leukocytes, UA: NEGATIVE
Nitrite, UA: NEGATIVE
Protein, UA: 30
Spec Grav, UA: 1.02
Urobilinogen, UA: 0.2
pH, UA: 6.5

## 2014-01-11 LAB — URINALYSIS, COMPLETE
Bacteria: NONE SEEN
Bilirubin,UR: NEGATIVE
Blood: NEGATIVE
Glucose,UR: NEGATIVE mg/dL (ref 0–75)
Ketone: NEGATIVE
Leukocyte Esterase: NEGATIVE
Nitrite: NEGATIVE
Ph: 8 (ref 4.5–8.0)
Protein: NEGATIVE
RBC,UR: 3 /HPF (ref 0–5)
Specific Gravity: 1.019 (ref 1.003–1.030)
Squamous Epithelial: <1
WBC UR: NONE SEEN /HPF (ref 0–5)

## 2014-01-11 LAB — LIPASE, BLOOD: Lipase: 183 U/L (ref 73–393)

## 2014-01-11 LAB — LIPASE: Lipase: 27 U/L (ref 11.0–59.0)

## 2014-01-11 MED ORDER — OMEPRAZOLE 40 MG PO CPDR: 40.0000 mg | DELAYED_RELEASE_CAPSULE | Freq: Every day | ORAL | Status: DC

## 2014-01-11 NOTE — Progress Notes (Signed)
Pre-visit discussion using our clinic review tool. No additional management support is needed unless otherwise documented below in the visit note.  

## 2014-01-11 NOTE — Assessment & Plan Note (Addendum)
Nonspecific sxs today including RLQ pain, dyspepsia sxs, and some guarding on abdominal exam but no rebound and neg psoas sign. UA overall unrevealing. I will obtain stat blood work including CMP, lipase and CBC and call pt with plan.  If abnormal, will want to obtain CT abd/pelvis to r/o appendicitis. Discussed if worsening to seek ER care.  Pt agrees. For dsypepsia sxs, will start omeprazole 40mg  daily for 3 wks then prn.  ADDENDUM ==> blood work returned with leukocytosis to 12.9 with left shift .  I called pt, he now has low grade fever.  Advised he go to ER for further eval and likely CT abd/pelvis.  Pt agrees.  Spoke with charge nurse to advise pt on his way.

## 2014-01-11 NOTE — Patient Instructions (Addendum)
Urine checked today. Stat blood work today - we will call you with results today. If any worsening abdominal pain or fever, please seek urgent care. Start on omeprazole 40mg  once daily for heartburn.

## 2014-01-11 NOTE — Progress Notes (Addendum)
BP 126/80  Pulse 100  Temp(Src) 98.3 F (36.8 C) (Oral)  Wt 172 lb 4 oz (78.132 kg)   CC: abd burning  Subjective:    Patient ID: Shane Garza, male    DOB: 06/13/1991, 23 y.o.   MRN: [id number]  HPI: Shane ArnFranklin Coby is a 23 y.o. male presenting on 01/11/2014 with Abdominal Pain  5d h/o lower abdominal pain that travels from right lower abdomen to left and upper abdomen.  Lasts 2-3 min at a time.  intersperced with sharp stabbing pains.  Worsened after eating chili/hot dogs.  + nausea with regurgitating acid.  Appetite decreased.  No fevers/chills, diarrhea or constipation, dysphagia.  Last BM earlier today, normal.  No urinary changes, dysuria or urgency or hematuria. Rare excedrin use. No other NSAID use. No EtOH recently (2 beers 5 d ago).  Endorses h/o appendicitis but didn't have surgery for this (4th grade) resolved on its own.  Past Medical History  Diagnosis Date  . Seasonal allergic rhinitis   . Recurrent sinusitis   . History of asthma     childhood    Past Surgical History  Procedure Laterality Date  . Hernia repair      Relevant past medical, surgical, family and social history reviewed and updated. Allergies and medications reviewed and updated. Current Outpatient Prescriptions on File Prior to Visit  Medication Sig  . aspirin-acetaminophen-caffeine (EXCEDRIN MIGRAINE) 250-250-65 MG per tablet Take 1 tablet by mouth every 6 (six) hours as needed.    . traMADol (ULTRAM) 50 MG tablet Take 1 tablet (50 mg total) by mouth every 6 (six) hours as needed for pain.   No current facility-administered medications on file prior to visit.    Review of Systems Per HPI unless specifically indicated above    Objective:    BP 126/80  Pulse 100  Temp(Src) 98.3 F (36.8 C) (Oral)  Wt 172 lb 4 oz (78.132 kg)  Physical Exam  Nursing note and vitals reviewed. Constitutional: He appears well-developed and well-nourished. No distress.  HENT:  Mouth/Throat:  Oropharynx is clear and moist. No oropharyngeal exudate.  Cardiovascular: Normal rate, regular rhythm, normal heart sounds and intact distal pulses.   No murmur heard. Pulmonary/Chest: Effort normal and breath sounds normal. No respiratory distress. He has no wheezes. He has no rales.  Abdominal: Soft. Normal appearance and bowel sounds are normal. He exhibits no distension and no mass. There is no hepatosplenomegaly. There is tenderness in the right lower quadrant, epigastric area and suprapubic area. There is guarding and CVA tenderness (R). There is no rigidity, no rebound and negative Murphy's sign.  Neg Psoas sign  Musculoskeletal: He exhibits no edema.       Assessment & Plan:   Problem List Items Addressed This Visit   Abdominal pain, unspecified site - Primary     Nonspecific sxs today including RLQ pain, dyspepsia sxs, and some guarding on abdominal exam but no rebound and neg psoas sign. UA overall unrevealing. I will obtain stat blood work including CMP, lipase and CBC and call pt with plan.  If abnormal, will want to obtain CT abd/pelvis to r/o appendicitis. Discussed if worsening to seek ER care.  Pt agrees. For dsypepsia sxs, will start omeprazole 40mg  daily for 3 wks then prn.  ADDENDUM ==> blood work returned with leukocytosis to 12.9 with left shift .  I called pt, he now has low grade fever.  Advised he go to ER for further eval and likely CT abd/pelvis.  Pt  agrees.  Spoke with charge nurse to advise pt on his way.    Relevant Orders      Comprehensive metabolic panel (Completed)      CBC with Differential (Completed)      Lipase (Completed)      POCT Urinalysis Dipstick (Completed)       Follow up plan: Return if symptoms worsen or fail to improve.

## 2014-01-12 ENCOUNTER — Encounter: Payer: Self-pay | Admitting: *Deleted

## 2014-01-12 ENCOUNTER — Telehealth: Payer: Self-pay | Admitting: Family Medicine

## 2014-01-12 ENCOUNTER — Encounter: Payer: Self-pay | Admitting: Family Medicine

## 2014-01-12 ENCOUNTER — Ambulatory Visit (INDEPENDENT_AMBULATORY_CARE_PROVIDER_SITE_OTHER): Payer: 59 | Admitting: Family Medicine

## 2014-01-12 VITALS — BP 118/70 | HR 68 | Temp 98.1°F | Wt 172.0 lb

## 2014-01-12 DIAGNOSIS — K5289 Other specified noninfective gastroenteritis and colitis: Secondary | ICD-10-CM

## 2014-01-12 NOTE — Assessment & Plan Note (Signed)
CT in ER showing diffuse ascending colitis.  Advised pt finish cipro/flagyl and have clear liquid diet for next 1-2 days then slowly advance as tolerated. Pt endorses longstanding h/o GI distress with irregular bowel habits, and intermittent blood in stool. Will refer to GI for further evaluation on nonurgent basis.

## 2014-01-12 NOTE — Telephone Encounter (Signed)
Not available at this time. I will call patient as soon as I have results.

## 2014-01-12 NOTE — Patient Instructions (Signed)
You have inflammation of ascending colon, likely from infection Finish cipro and flagyl course.   We will also set you up with GI for an opinion on possible inflammatory bowel disease, but I think this was more likely due to infection and should resolve well. Clear liquid diet for next few days then slowly advance as tolerated.  Colitis Colitis is inflammation of the colon. Colitis can be a short-term or long-standing (chronic) illness. Crohn's disease and ulcerative colitis are 2 types of colitis which are chronic. They usually require lifelong treatment. CAUSES  There are many different causes of colitis, including:  Viruses.  Germs (bacteria).  Medicine reactions. SYMPTOMS   Diarrhea.  Intestinal bleeding.  Pain.  Fever.  Throwing up (vomiting).  Tiredness (fatigue).  Weight loss.  Bowel blockage. DIAGNOSIS  The diagnosis of colitis is based on examination and stool or blood tests. X-rays, CT scan, and colonoscopy may also be needed. TREATMENT  Treatment may include:  Fluids given through the vein (intravenously).  Bowel rest (nothing to eat or drink for a period of time).  Medicine for pain and diarrhea.  Medicines (antibiotics) that kill germs.  Cortisone medicines.  Surgery. HOME CARE INSTRUCTIONS   Get plenty of rest.  Drink enough water and fluids to keep your urine clear or pale yellow.  Eat a well-balanced diet.  Call your caregiver for follow-up as recommended. SEEK IMMEDIATE MEDICAL CARE IF:   You develop chills.  You have an oral temperature above 102 F (38.9 C), not controlled by medicine.  You have extreme weakness, fainting, or dehydration.  You have repeated vomiting.  You develop severe belly (abdominal) pain or are passing bloody or tarry stools. MAKE SURE YOU:   Understand these instructions.  Will watch your condition.  Will get help right away if you are not doing well or get worse. Document Released: 12/18/2004  Document Revised: 02/02/2012 Document Reviewed: 03/15/2010 [facility] Patient Information 2014 [place], [place].

## 2014-01-12 NOTE — Progress Notes (Signed)
BP 118/70  Pulse 68  Temp(Src) 98.1 F (36.7 C) (Oral)  Wt 172 lb (78.019 kg)   CC: f/u [facility] ER visit  Subjective:    Patient ID: Shane Garza, male    DOB: 09/11/1991, 23 y.o.   MRN: [id number]  HPI: Shane Garza is a 23 y.o. male presenting on 01/12/2014 with Follow-up  See prior note for details.  Briefly, seen yesterday with RLQ abd pain without BM changes and WBC elevated to 12.6, and low grade fever.  Sent to [facility] ER for CT scan - which showed marked ascending colitis without complicating features.  Appendix OK.  Pt placed on cipro and flagyl course.  Radiology at that time did not feel this was consistent with IBD, but pt has been referred to GI Dr. Bluford Kaufmann for further evaluation.  Has not been out of the country.  Has not eaten any new foods.  Drinks bottled water. Has had GI issues since child.  Did notice blood in stool in past, but not recently. He has battled with diarrhea/constipation since child, never been regular. Small stool yesterday.  Appetite decreased still.  Continued nausea but declines anti nausea med.  Past Medical History  Diagnosis Date  . Seasonal allergic rhinitis   . Recurrent sinusitis   . History of asthma     childhood  . Colitis 2015    [facility] ER    Past Surgical History  Procedure Laterality Date  . Hernia repair     Family History  Problem Relation Age of Onset  . Hypertension Father   . Diabetes Paternal Grandfather     Relevant past medical, surgical, family and social history reviewed and updated. Allergies and medications reviewed and updated. Current Outpatient Prescriptions on File Prior to Visit  Medication Sig  . aspirin-acetaminophen-caffeine (EXCEDRIN MIGRAINE) 250-250-65 MG per tablet Take 1 tablet by mouth every 6 (six) hours as needed.    Marland Kitchen omeprazole (PRILOSEC) 40 MG capsule Take 1 capsule (40 mg total) by mouth daily.  . traMADol (ULTRAM) 50 MG tablet Take 1 tablet (50 mg total) by mouth every 6 (six) hours as needed for  pain.   No current facility-administered medications on file prior to visit.    Review of Systems Per HPI unless specifically indicated above    Objective:    BP 118/70  Pulse 68  Temp(Src) 98.1 F (36.7 C) (Oral)  Wt 172 lb (78.019 kg)  Physical Exam  Nursing note and vitals reviewed. Constitutional: He appears well-developed and well-nourished. No distress.  HENT:  Mouth/Throat: Oropharynx is clear and moist. No oropharyngeal exudate.  Cardiovascular: Normal rate, regular rhythm, normal heart sounds and intact distal pulses.   No murmur heard. Pulmonary/Chest: Effort normal and breath sounds normal. No respiratory distress. He has no wheezes. He has no rales.  Abdominal: Soft. Normal appearance and bowel sounds are normal. He exhibits no distension and no mass. There is no hepatosplenomegaly. There is tenderness in the right lower quadrant and suprapubic area. There is no rigidity, no rebound, no guarding, no CVA tenderness and negative Murphy's sign.  Musculoskeletal: He exhibits no edema.   Results for orders placed in visit on 01/11/14  COMPREHENSIVE METABOLIC PANEL      Result Value Ref Range   Sodium 132 (*) 135 - 145 mEq/L   Potassium 4.6  3.5 - 5.1 mEq/L   Chloride 94 (*) 96 - 112 mEq/L   CO2 28  19 - 32 mEq/L   Glucose, Bld 84  70 -  99 mg/dL   BUN 10  6 - 23 mg/dL   Creatinine, Ser 1.0  0.4 - 1.5 mg/dL   Total Bilirubin 0.8  0.3 - 1.2 mg/dL   Alkaline Phosphatase 81  39 - 117 U/L   AST 21  0 - 37 U/L   ALT 24  0 - 53 U/L   Total Protein 8.5 (*) 6.0 - 8.3 g/dL   Albumin 4.6  3.5 - 5.2 g/dL   Calcium 9.9  8.4 - 16.110.5 mg/dL   GFR 096.04101.23  >54.09>60.00 mL/min  CBC WITH DIFFERENTIAL      Result Value Ref Range   WBC 12.6 (*) 4.5 - 10.5 K/uL   RBC 5.27  4.22 - 5.81 Mil/uL   Hemoglobin 16.9  13.0 - 17.0 g/dL   HCT 81.150.1  91.439.0 - 78.252.0 %   MCV 95.0  78.0 - 100.0 fl   MCHC 33.7  30.0 - 36.0 g/dL   RDW 95.613.1  21.311.5 - 08.614.6 %   Platelets 249.0  150.0 - 400.0 K/uL   Neutrophils  Relative % 75.2  43.0 - 77.0 %   Lymphocytes Relative 14.7  12.0 - 46.0 %   Monocytes Relative 9.3  3.0 - 12.0 %   Eosinophils Relative 0.5  0.0 - 5.0 %   Basophils Relative 0.3  0.0 - 3.0 %   Neutro Abs 9.5 (*) 1.4 - 7.7 K/uL   Lymphs Abs 1.9  0.7 - 4.0 K/uL   Monocytes Absolute 1.2 (*) 0.1 - 1.0 K/uL   Eosinophils Absolute 0.1  0.0 - 0.7 K/uL   Basophils Absolute 0.0  0.0 - 0.1 K/uL  LIPASE      Result Value Ref Range   Lipase 27.0  11.0 - 59.0 U/L  POCT URINALYSIS DIPSTICK      Result Value Ref Range   Color, UA Amber     Clarity, UA Clear     Glucose, UA Negative     Bilirubin, UA Negative     Ketones, UA Negative     Spec Grav, UA 1.020     Blood, UA Negative     pH, UA 6.5     Protein, UA 30     Urobilinogen, UA 0.2     Nitrite, UA Negative     Leukocytes, UA Negative        Assessment & Plan:   Problem List Items Addressed This Visit   Right sided colitis without complications - Primary     CT in ER showing diffuse ascending colitis.  Advised pt finish cipro/flagyl and have clear liquid diet for next 1-2 days then slowly advance as tolerated. Pt endorses longstanding h/o GI distress with irregular bowel habits, and intermittent blood in stool. Will refer to GI for further evaluation on nonurgent basis.    Relevant Orders      Ambulatory referral to Gastroenterology       Follow up plan: Return if symptoms worsen or fail to improve.

## 2014-01-12 NOTE — Telephone Encounter (Signed)
Confidential Office Message [address] [place], [place] [address] p. [phone] f. [phone] To: [facility] (After Hours [facility]) Fax: [phone] From: [facility] Date/ Time: 01/11/2014 6:27 PM Taken By: Jethro BolusallerDale [place]: [place] Facility: not collected Patient: Shane Garza, Shane Garza DOB: 11/04/1991 Phone: [phone] Reason for Call: Calling to get lab results from this afternoon OV> Regarding Appointment: Appt Date: Appt Time: Unknown Provider: Reason: Details: Outcome: Confidential

## 2014-01-22 HISTORY — PX: ESOPHAGOGASTRODUODENOSCOPY: SHX1529

## 2014-01-22 HISTORY — PX: COLONOSCOPY: SHX174

## 2014-02-08 ENCOUNTER — Ambulatory Visit: Payer: Self-pay | Admitting: Gastroenterology

## 2014-02-18 ENCOUNTER — Encounter: Payer: Self-pay | Admitting: Family Medicine

## 2014-02-25 ENCOUNTER — Encounter: Payer: Self-pay | Admitting: Family Medicine

## 2014-03-03 ENCOUNTER — Encounter: Payer: Self-pay | Admitting: Family Medicine

## 2014-07-31 ENCOUNTER — Other Ambulatory Visit: Payer: Self-pay | Admitting: Family Medicine

## 2014-08-17 ENCOUNTER — Encounter: Payer: Self-pay | Admitting: Family Medicine

## 2014-08-17 ENCOUNTER — Ambulatory Visit (INDEPENDENT_AMBULATORY_CARE_PROVIDER_SITE_OTHER): Payer: 59 | Admitting: Family Medicine

## 2014-08-17 VITALS — BP 100/64 | HR 57 | Temp 98.3°F | Ht 64.25 in | Wt 176.5 lb

## 2014-08-17 DIAGNOSIS — M7581 Other shoulder lesions, right shoulder: Secondary | ICD-10-CM | POA: Insufficient documentation

## 2014-08-17 DIAGNOSIS — M67919 Unspecified disorder of synovium and tendon, unspecified shoulder: Secondary | ICD-10-CM

## 2014-08-17 DIAGNOSIS — M719 Bursopathy, unspecified: Secondary | ICD-10-CM

## 2014-08-17 MED ORDER — DICLOFENAC SODIUM 75 MG PO TBEC: 75.0000 mg | DELAYED_RELEASE_TABLET | Freq: Two times a day (BID) | ORAL | Status: DC

## 2014-08-17 NOTE — Assessment & Plan Note (Signed)
NSAIDs, Ice, limit lifting and  Repetitive shoulder motion. Start home PT.

## 2014-08-17 NOTE — Progress Notes (Signed)
Pre visit review using our clinic review tool, if applicable. No additional management support is needed unless otherwise documented below in the visit note. 

## 2014-08-17 NOTE — Patient Instructions (Signed)
Start diclofenac 75 mg twice daily.  Ice on shoulder. Limit lifting and twisting of right shoulder. No lifting > 10 lbs. Start home physical therapy.  Follow up with PCP in 2 weeks if not improved.

## 2014-08-17 NOTE — Progress Notes (Signed)
   Subjective:    Patient ID: Shane Garza, male    DOB: [birth year], 23 y.o.   MRN: [id number]  Shoulder Pain  Pertinent negatives include no fever.    23 year old male pt of Dr. Patrice Paradise presents with new onset right shoulder pain.  Ongoing x 1 month. No proceeding fall, injury.  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 Pain in anterior and posterior shoulder. Mainly with ext rotation and abduction  Occ sharp pain running down arm and numbness( pins and needles) in hand. No neck pain, some neck stiffness. No weakness.   Tylenol helped minimally.    Review of Systems  Constitutional: Negative for fever and fatigue.  HENT: Negative for ear pain.   Eyes: Negative for pain.  Respiratory: Negative for cough.   Cardiovascular: Negative for chest pain.       Objective:   Physical Exam  Constitutional: He appears well-developed and well-nourished.  Neck: Normal range of motion. Neck supple. No thyromegaly present.  Cardiovascular: Normal rate.   No murmur heard. Pulmonary/Chest: Effort normal and breath sounds normal.  Abdominal: Soft. Bowel sounds are normal. There is no tenderness.  Musculoskeletal:       Right shoulder: He exhibits decreased range of motion, tenderness and bony tenderness.  ttp over anterior subacromial joint.  Some muscle spasm below shoulder blade on right.  neg drop arm test.  Pos neer's,  Neg spurling's.  Lymphadenopathy:    He has no cervical adenopathy.          Assessment & Plan:

## 2015-02-16 ENCOUNTER — Other Ambulatory Visit: Payer: Self-pay | Admitting: Family Medicine

## 2015-03-18 IMAGING — CT CT ABD-PELV W/ CM
2 of 4 series · 16 of 46 positions shown, 18 images · IV contrast (isovue)
Comparison: None.

CLINICAL DATA: Low abdominal pain, elevated white count.

EXAM:
CT ABDOMEN AND PELVIS WITH CONTRAST
TECHNIQUE: Multidetector CT imaging of the abdomen and pelvis was performed
using the standard protocol following bolus administration of
intravenous contrast.
CONTRAST:  125 mL Isovue 370 IV

[Series 2: routine abd pel with · axial · 0.70mm/px · z∈[-882,-467]mm · 13 of 91 slices shown, 15 images]
[im 4/91  soft-tissue]
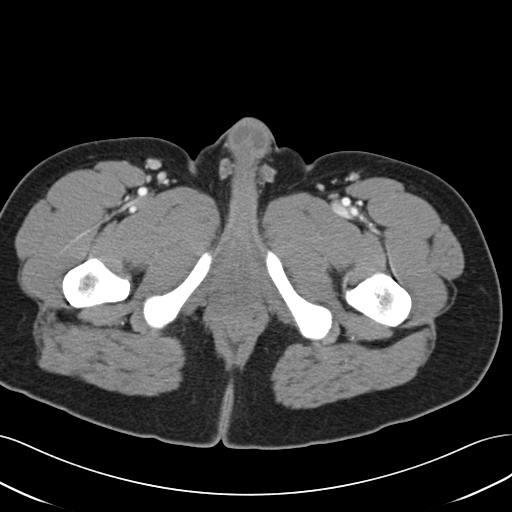
[im 4/91  bone]
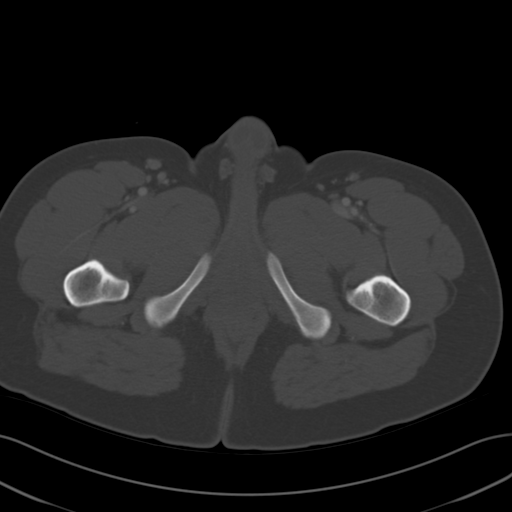
[im 11/91  soft-tissue]
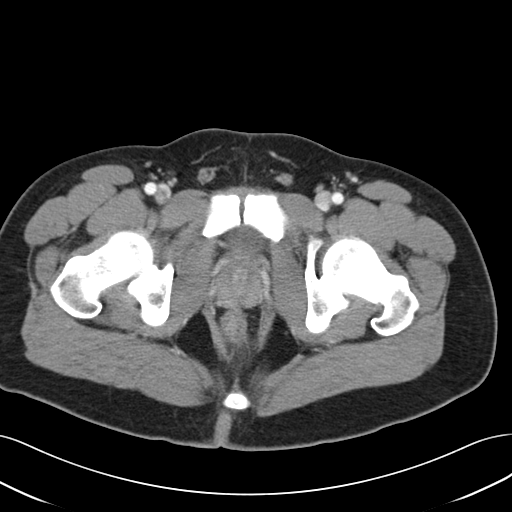
[im 19/91  soft-tissue]
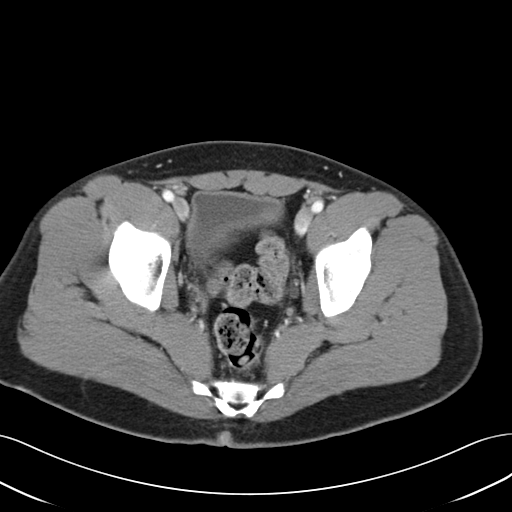
[im 26/91  soft-tissue]
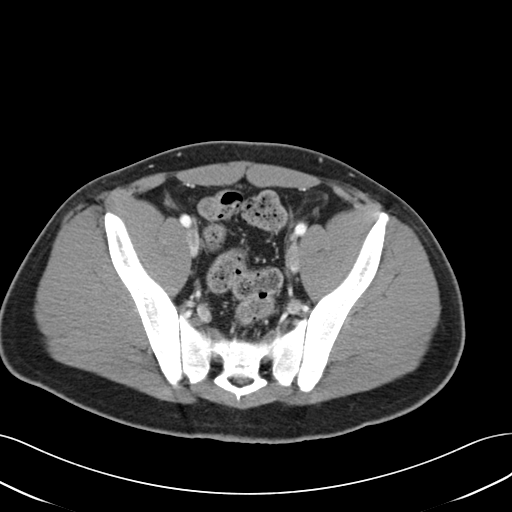
[im 33/91  soft-tissue]
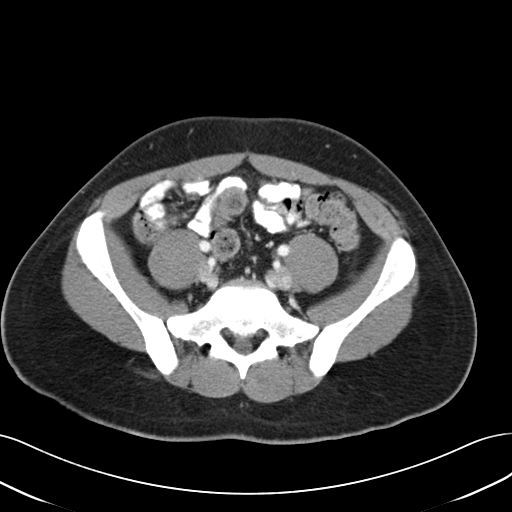
[im 40/91  soft-tissue]
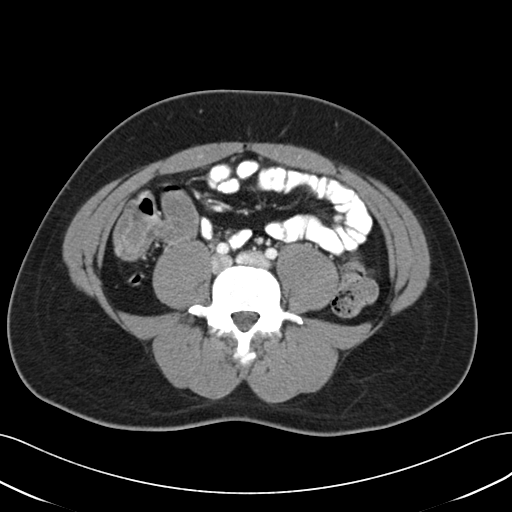
[im 47/91  soft-tissue]
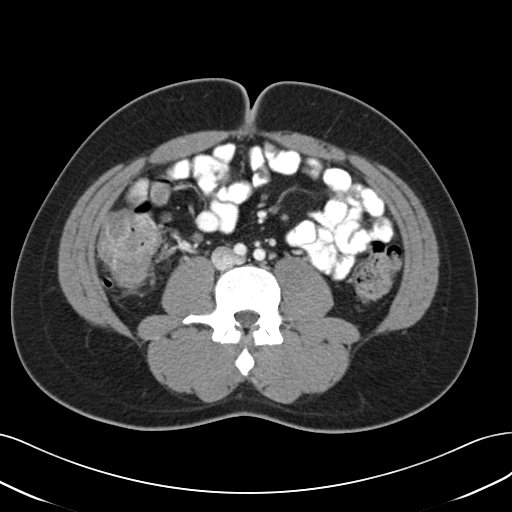
[im 51/91  soft-tissue]
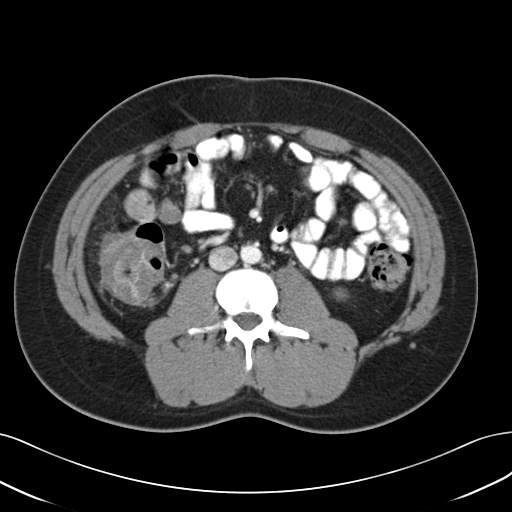
[im 58/91  soft-tissue]
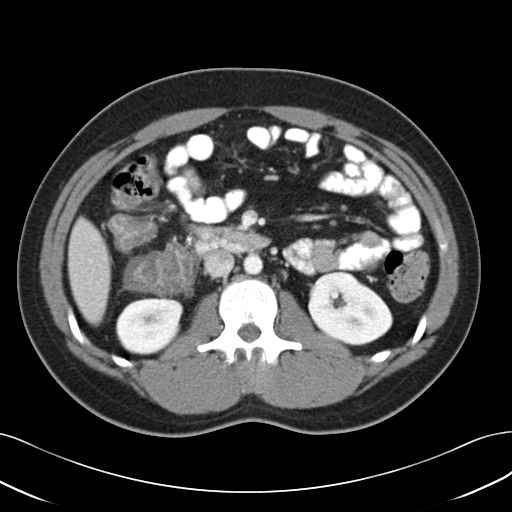
[im 58/91  bone]
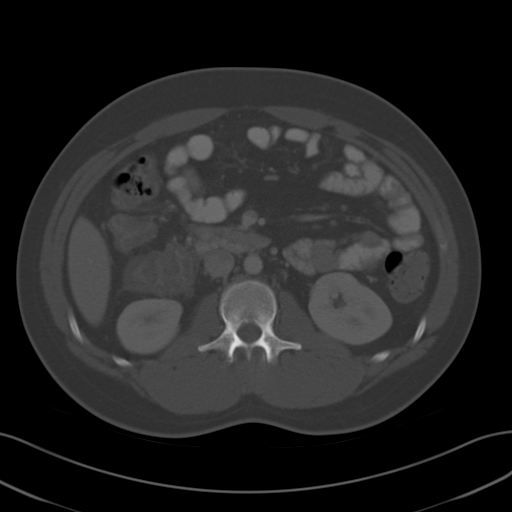
[im 65/91  soft-tissue]
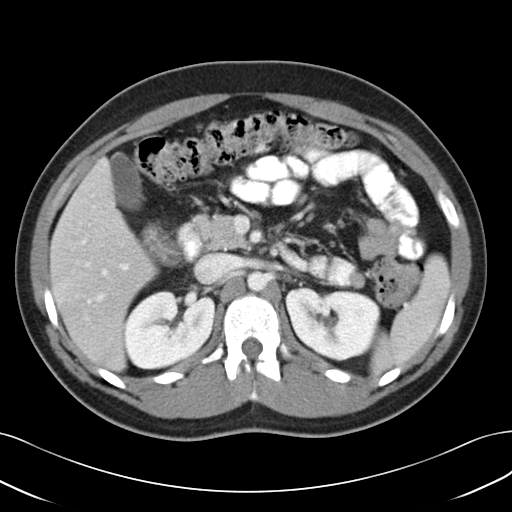
[im 73/91  soft-tissue]
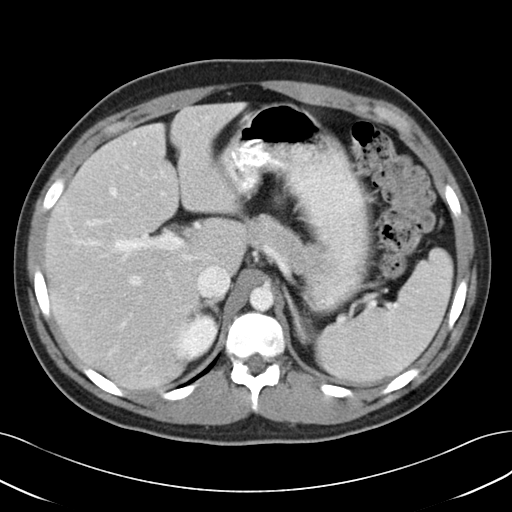
[im 80/91  soft-tissue]
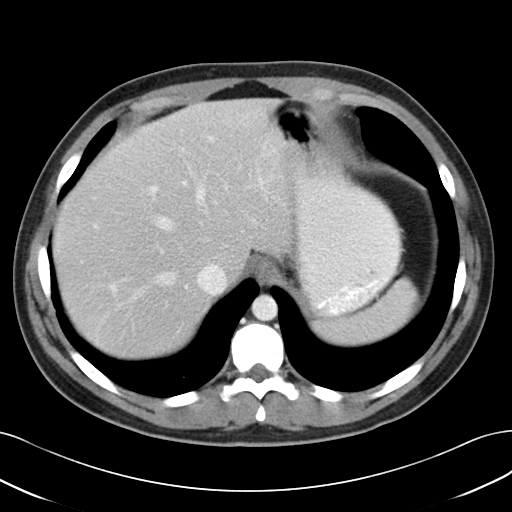
[im 87/91  soft-tissue]
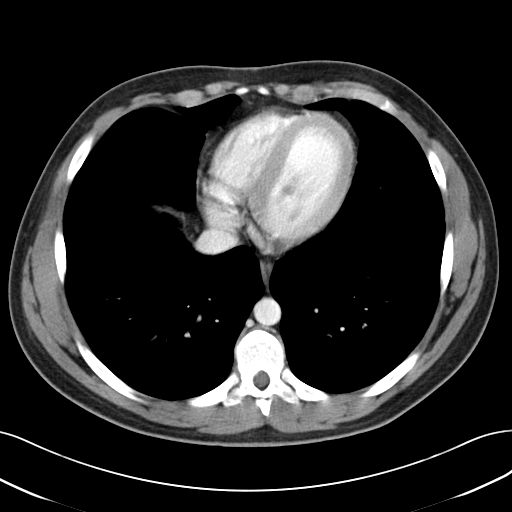

[Series 5: cor routine abd pel with · coronal · 0.64mm/px · 3 of 124 slices shown]
[im 42/124  soft-tissue]
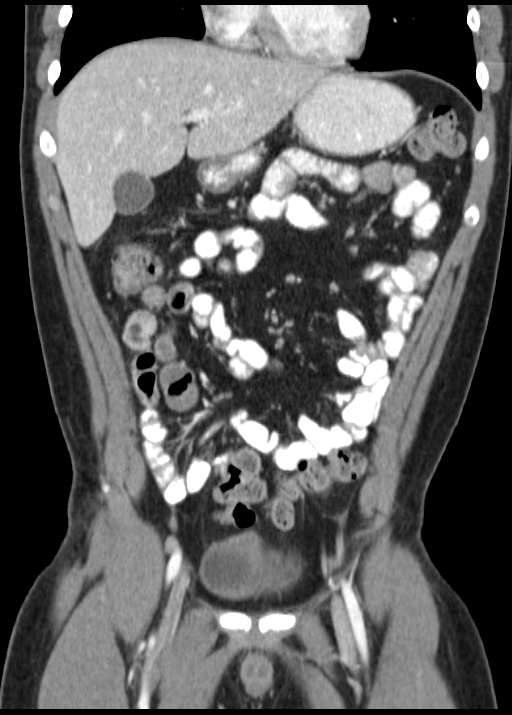
[im 55/124  soft-tissue]
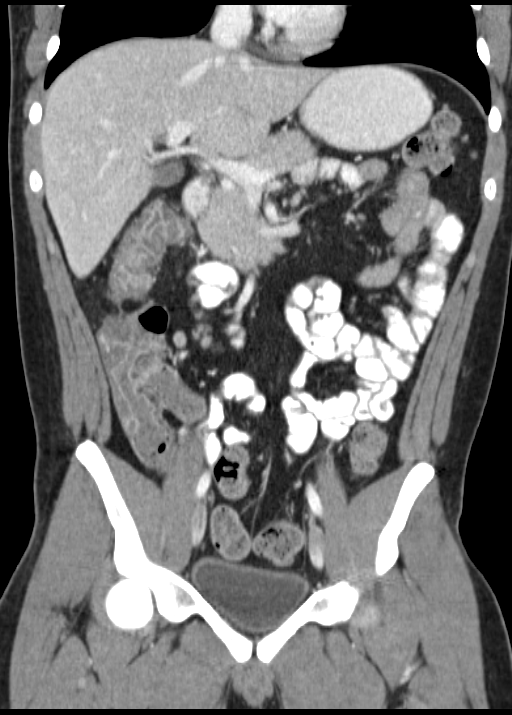
[im 69/124  soft-tissue]
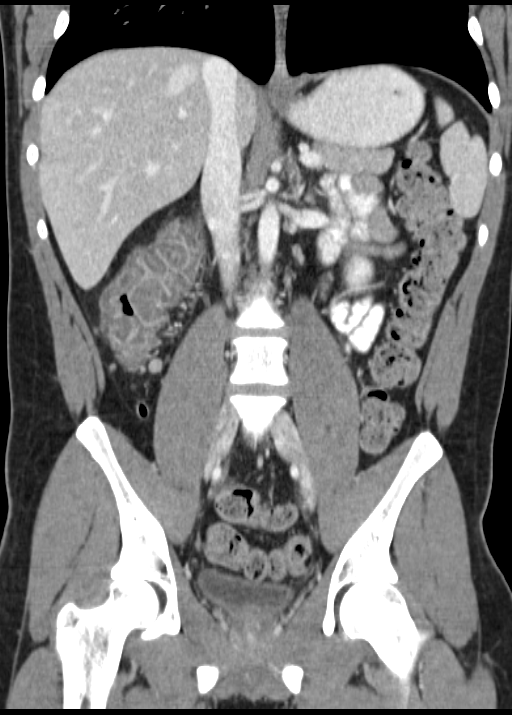

[16 of 46 positions shown; findings below may reference images not displayed]

FINDINGS: Visualized lung bases clear. Unremarkable liver, nondilated
gallbladder, spleen, adrenal glands, kidneys, pancreas, aorta,
portal vein. Stomach, small bowel, and colon are nondilated.
Unremarkable terminal ileum. Normal appendix. There is marked wall
thickening involving ascending colon to the hepatic flexure. There
are mild adjacent inflammatory/ edematous changes. No evidence of
perforation or abscess. No pneumatosis. Distal colon unremarkable.
Urinary bladder is incompletely distended. No ascites. No free air.
No adenopathy localized. Lumbar spine unremarkable.
IMPRESSION: 1. Normal appendix.
2. Marked colitis involving ascending colon, without complicating
features.

## 2015-07-02 ENCOUNTER — Other Ambulatory Visit: Payer: Self-pay | Admitting: Family Medicine

## 2015-08-08 ENCOUNTER — Other Ambulatory Visit: Payer: Self-pay | Admitting: Family Medicine

## 2015-08-09 NOTE — Telephone Encounter (Signed)
Pt scheduled CPX on 10/26/15 at 3 pm with Dr Erline Hau CPX appts were offered to pt but pt works out of town and this was first appt satisfactory for pts schedule. Pt will get OTC prilosec until seen.

## 2015-08-14 ENCOUNTER — Ambulatory Visit: Payer: Self-pay | Admitting: Family Medicine

## 2015-10-26 ENCOUNTER — Encounter: Payer: Self-pay | Admitting: Family Medicine

## 2015-10-26 ENCOUNTER — Ambulatory Visit (INDEPENDENT_AMBULATORY_CARE_PROVIDER_SITE_OTHER): Payer: 59 | Admitting: Family Medicine

## 2015-10-26 VITALS — BP 110/80 | HR 68 | Temp 98.3°F | Ht 63.0 in | Wt 140.2 lb

## 2015-10-26 DIAGNOSIS — Z0000 Encounter for general adult medical examination without abnormal findings: Secondary | ICD-10-CM

## 2015-10-26 DIAGNOSIS — Z23 Encounter for immunization: Secondary | ICD-10-CM | POA: Diagnosis not present

## 2015-10-26 DIAGNOSIS — Z131 Encounter for screening for diabetes mellitus: Secondary | ICD-10-CM

## 2015-10-26 DIAGNOSIS — R229 Localized swelling, mass and lump, unspecified: Secondary | ICD-10-CM

## 2015-10-26 DIAGNOSIS — D72829 Elevated white blood cell count, unspecified: Secondary | ICD-10-CM

## 2015-10-26 DIAGNOSIS — Z13220 Encounter for screening for lipoid disorders: Secondary | ICD-10-CM

## 2015-10-26 LAB — LIPID PANEL
Cholesterol: 134 mg/dL (ref 125–200)
HDL: 48 mg/dL (ref >=40)
LDL Cholesterol: 71 mg/dL (ref <130)
Total CHOL/HDL Ratio: 2.8 Ratio (ref <=5.0)
Triglycerides: 73 mg/dL (ref <150)
VLDL: 15 mg/dL (ref <30)

## 2015-10-26 LAB — CBC WITH DIFFERENTIAL/PLATELET
Basophils Absolute: 0 10*3/uL (ref 0.0–0.1)
Basophils Relative: 0 % (ref 0–1)
Eosinophils Absolute: 0.2 10*3/uL (ref 0.0–0.7)
Eosinophils Relative: 2 % (ref 0–5)
HCT: 44.9 % (ref 39.0–52.0)
Hemoglobin: 15.6 g/dL (ref 13.0–17.0)
Lymphocytes Relative: 30 % (ref 12–46)
Lymphs Abs: 2.9 10*3/uL (ref 0.7–4.0)
MCH: 31.9 pg (ref 26.0–34.0)
MCHC: 34.7 g/dL (ref 30.0–36.0)
MCV: 91.8 fL (ref 78.0–100.0)
MPV: 9.8 fL (ref 8.6–12.4)
Monocytes Absolute: 0.7 10*3/uL (ref 0.1–1.0)
Monocytes Relative: 7 % (ref 3–12)
Neutro Abs: 5.8 10*3/uL (ref 1.7–7.7)
Neutrophils Relative %: 61 % (ref 43–77)
Platelets: 267 10*3/uL (ref 150–400)
RBC: 4.89 MIL/uL (ref 4.22–5.81)
RDW: 12.8 % (ref 11.5–15.5)
WBC: 9.5 10*3/uL (ref 4.0–10.5)

## 2015-10-26 LAB — COMPREHENSIVE METABOLIC PANEL
ALT: 45 U/L (ref 9–46)
AST: 46 U/L — ABNORMAL HIGH (ref 10–40)
Albumin: 5.1 g/dL (ref 3.6–5.1)
Alkaline Phosphatase: 92 U/L (ref 40–115)
BUN: 19 mg/dL (ref 7–25)
CO2: 25 mmol/L (ref 20–31)
Calcium: 9.7 mg/dL (ref 8.6–10.3)
Chloride: 102 mmol/L (ref 98–110)
Creat: 0.87 mg/dL (ref 0.60–1.35)
Glucose, Bld: 79 mg/dL (ref 65–99)
Potassium: 3.9 mmol/L (ref 3.5–5.3)
Sodium: 136 mmol/L (ref 135–146)
Total Bilirubin: 0.8 mg/dL (ref 0.2–1.2)
Total Protein: 7.7 g/dL (ref 6.1–8.1)

## 2015-10-26 LAB — URIC ACID: Uric Acid, Serum: 6.6 mg/dL (ref 4.0–7.8)

## 2015-10-26 MED ORDER — OMEPRAZOLE 20 MG PO CPDR: 20.0000 mg | DELAYED_RELEASE_CAPSULE | Freq: Every day | ORAL | Status: DC

## 2015-10-26 NOTE — Progress Notes (Signed)
BP 110/80 mmHg  Pulse 68  Temp(Src) 98.3 F (36.8 C) (Oral)  Ht 5' 3"  (1.6 m)  Wt 140 lb 4 oz (63.617 kg)  BMI 24.85 kg/m2   CC: CPE  Subjective:    Patient ID: Shane Garza, male    DOB: November 11, 1991, 24 y.o.   MRN: [id number]  HPI: Shane Garza is a 24 y.o. male presenting on 10/26/2015 for Annual Exam   GERD - prior controlled with PPI but he has been off PPI for last few months.   Migraines - improved. Intermittent excedrin use.  Ears occasionally ache - ongoing issue. Tinnitus. Notes intermittent blood in ears. Has self cleaned with ear wick. Intermittent clogged ears. No recent swimming. No fevers.   Preventative: Flu shot today Tdap 2012 Seat belt use discussed Sunscreen use discussed. No changing moles on skin.   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 Interim medical history since our last visit reviewed. Allergies and medications reviewed and updated. Current Outpatient Prescriptions on File Prior to Visit  Medication Sig  . aspirin-acetaminophen-caffeine (EXCEDRIN MIGRAINE) 250-250-65 MG per tablet Take 1 tablet by mouth every 6 (six) hours as needed.     No current facility-administered medications on file prior to visit.    Review of Systems  Constitutional: Negative for fever, chills, activity change, appetite change, fatigue and unexpected weight change.  HENT: Negative for hearing loss.   Eyes: Negative for visual disturbance.  Respiratory: Negative for cough, chest tightness, shortness of breath and wheezing.   Cardiovascular: Negative for chest pain, palpitations and leg swelling.  Gastrointestinal: Negative for nausea, vomiting, abdominal pain, diarrhea, constipation, blood in stool and abdominal distention.  Genitourinary: Negative for  hematuria and difficulty urinating.  Musculoskeletal: Negative for myalgias, arthralgias and neck pain.  Skin: Negative for rash.  Neurological: Negative for dizziness, seizures, syncope and headaches.  Hematological: Negative for adenopathy. Does not bruise/bleed easily.  Psychiatric/Behavioral: Negative for dysphoric mood. The patient is not nervous/anxious.    Per HPI unless specifically indicated in ROS section     Objective:    BP 110/80 mmHg  Pulse 68  Temp(Src) 98.3 F (36.8 C) (Oral)  Ht 5' 3"  (1.6 m)  Wt 140 lb 4 oz (63.617 kg)  BMI 24.85 kg/m2  Wt Readings from Last 3 Encounters:  10/26/15 140 lb 4 oz (63.617 kg)  08/17/14 176 lb 8 oz (80.06 kg)  01/12/14 172 lb (78.019 kg)    Physical Exam  Constitutional: He is oriented to person, place, and time. He appears well-developed and well-nourished. No distress.  HENT:  Head: Normocephalic and atraumatic.  Right Ear: Hearing, tympanic membrane, external ear and ear canal normal.  Left Ear: Hearing, tympanic membrane, external ear and ear canal normal.  Nose: Nose normal.  Mouth/Throat: Uvula is midline, oropharynx is clear and moist and mucous membranes are normal. No oropharyngeal exudate, posterior oropharyngeal edema or posterior oropharyngeal erythema.  Cerumen R>L ear cleaned out with plastic curette  Eyes: Conjunctivae and EOM are normal. Pupils are equal, round, and reactive to light. No scleral icterus.  Neck: Normal range of motion. Neck supple. No thyromegaly present.  Cardiovascular: Normal rate, regular rhythm, normal heart sounds and intact distal pulses.   No murmur heard. Pulses:      Radial pulses are 2+ on the right side, and 2+ on the  left side.  Pulmonary/Chest: Effort normal and breath sounds normal. No respiratory distress. He has no wheezes. He has no rales.  Abdominal: Soft. Bowel sounds are normal. He exhibits no distension and no mass. There is no tenderness. There is no rebound and no guarding.    Musculoskeletal: Normal range of motion. He exhibits no edema.  Skin nodules right middle PIP joint without erythema, nontender  Lymphadenopathy:    He has no cervical adenopathy.  Neurological: He is alert and oriented to person, place, and time.  CN grossly intact, station and gait intact  Skin: Skin is warm and dry. No rash noted.  Psychiatric: He has a normal mood and affect. His behavior is normal. Judgment and thought content normal.  Nursing note and vitals reviewed.  Results for orders placed or performed in visit on 01/11/14  CBC with Differential/Platelet  Result Value Ref Range   WBC 14.9 (H) 3.8-10.6 x10 3/mm 3   RBC 5.13 4.40-5.90 x10 6/mm 3   HGB 16.2 13.0-18.0 g/dL   HCT 47.1 40.0-52.0 %   MCV 92 80-100 fL   MCH 31.5 26.0-34.0 pg   MCHC 34.3 32.0-36.0 g/dL   RDW 13.2 11.5-14.5 %   Platelet 235 150-440 x10 3/mm 3   Neutrophil % 76.4 %   Lymphocyte % 13.8 %   Monocyte % 8.4 %   Eosinophil % 0.6 %   Basophil % 0.8 %   Neutrophil # 11.4 (H) 1.4-6.5 x10 3/mm 3   Lymphocyte # 2.1 1.0-3.6 x10 3/mm 3   Monocyte # 1.3 (H) 0.2-1.0 x10 3/mm    Eosinophil # 0.1 0.0-0.7 x10 3/mm 3   Basophil # 0.1 0.0-0.1 x10 3/mm 3  Comprehensive metabolic panel  Result Value Ref Range   Glucose 96 65-99 mg/dL   BUN 14 7-18 mg/dL   Creatinine 0.93 0.60-1.30 mg/dL   Sodium 133 (L) 136-145 mmol/L   Potassium 4.0 3.5-5.1 mmol/L   Chloride 101 98-107 mmol/L   Co2 27 21-32 mmol/L   Calcium, Total 9.5 8.5-10.1 mg/dL   SGOT(AST) 26 15-37 Unit/L   SGPT (ALT) 28 12-78 U/L   Alkaline Phosphatase 105 Unit/L   Albumin 4.1 3.4-5.0 g/dL   Total Protein 8.5 (H) 6.4-8.2 g/dL   Bilirubin,Total 0.7 0.2-1.0 mg/dL   Osmolality 267 275-301   Anion Gap 5 (L) 7-16   EGFR (African American) >60    EGFR (Non-African Amer.) >60   Lipase, blood  Result Value Ref Range   Lipase 183 73-393 Unit/L  Urinalysis, Complete  Result Value Ref Range   Color - urine Yellow    Clarity - urine Cloudy     Glucose,UR Negative 0-75 mg/dL   Bilirubin,UR Negative NEGATIVE   Ketone Negative NEGATIVE   Specific Gravity 1.019 1.003-1.030   Blood Negative NEGATIVE   Ph 8.0 4.5-8.0   Protein Negative NEGATIVE   Nitrite Negative NEGATIVE   Leukocyte Esterase Negative NEGATIVE   RBC,UR 3 /HPF 0-5 /HPF   WBC UR NONE SEEN 0-5 /HPF   Bacteria NONE SEEN NONE SEEN   Squamous Epithelial <1 /HPF    Amorphous Crystal PRESENT       Assessment & Plan:   Problem List Items Addressed This Visit    Skin nodule    Overlying R PIP joint. Check uric acid in family history of gout.       Relevant Orders   Uric acid   Healthcare maintenance - Primary    Preventative protocols reviewed and updated unless pt declined. Discussed  healthy diet and lifestyle.        Other Visit Diagnoses    Need for influenza vaccination        Relevant Orders    Flu Vaccine QUAD 36+ mos PF IM (Fluarix & Fluzone Quad PF)    Lipid screening        Relevant Orders    Lipid panel    Diabetes mellitus screening        Relevant Orders    Comprehensive metabolic panel    Leukocytosis        Relevant Orders    CBC with Differential/Platelet        Follow up plan: Return in about 1 year (around 10/25/2016), or as needed, for annual exam, prior fasting for blood work.

## 2015-10-26 NOTE — Assessment & Plan Note (Signed)
Preventative protocols reviewed and updated unless pt declined. Discussed healthy diet and lifestyle.  

## 2015-10-26 NOTE — Patient Instructions (Addendum)
Flu shot today. Blood work today. Return as needed or in 1 year for next physical.  Health Maintenance, Male A healthy lifestyle and preventative care can promote health and wellness.  Maintain regular health, dental, and eye exams.  Eat a healthy diet. Foods like vegetables, fruits, whole grains, low-fat dairy products, and lean protein foods contain the nutrients you need and are low in calories. Decrease your intake of foods high in solid fats, added sugars, and salt. Get information about a proper diet from your health care provider, if necessary.  Regular physical exercise is one of the most important things you can do for your health. Most adults should get at least 150 minutes of moderate-intensity exercise (any activity that increases your heart rate and causes you to sweat) each week. In addition, most adults need muscle-strengthening exercises on 2 or more days a week.   Maintain a healthy weight. The body mass index (BMI) is a screening tool to identify possible weight problems. It provides an estimate of body fat based on height and weight. Your health care provider can find your BMI and can help you achieve or maintain a healthy weight. For males 20 years and older:  A BMI below 18.5 is considered underweight.  A BMI of 18.5 to 24.9 is normal.  A BMI of 25 to 29.9 is considered overweight.  A BMI of 30 and above is considered obese.  Maintain normal blood lipids and cholesterol by exercising and minimizing your intake of saturated fat. Eat a balanced diet with plenty of fruits and vegetables. Blood tests for lipids and cholesterol should begin at age 23 and be repeated every 5 years. If your lipid or cholesterol levels are high, you are over age 23, or you are at high risk for heart disease, you may need your cholesterol levels checked more frequently.Ongoing high lipid and cholesterol levels should be treated with medicines if diet and exercise are not working.  If you smoke,  find out from your health care provider how to quit. If you do not use tobacco, do not start.  Lung cancer screening is recommended for adults aged 55-80 years who are at high risk for developing lung cancer because of a history of smoking. A yearly low-dose CT scan of the lungs is recommended for people who have at least a 30-pack-year history of smoking and are current smokers or have quit within the past 15 years. A pack year of smoking is smoking an average of 1 pack of cigarettes a day for 1 year (for example, a 30-pack-year history of smoking could mean smoking 1 pack a day for 30 years or 2 packs a day for 15 years). Yearly screening should continue until the smoker has stopped smoking for at least 15 years. Yearly screening should be stopped for people who develop a health problem that would prevent them from having lung cancer treatment.  If you choose to drink alcohol, do not have more than 2 drinks per day. One drink is considered to be 12 oz (360 mL) of beer, 5 oz (150 mL) of wine, or 1.5 oz (45 mL) of liquor.  Avoid the use of street drugs. Do not share needles with anyone. Ask for help if you need support or instructions about stopping the use of drugs.  High blood pressure causes heart disease and increases the risk of stroke. High blood pressure is more likely to develop in:  People who have blood pressure in the end of the normal range (  100-139/85-89 mm Hg).  People who are overweight or obese.  People who are African American.  If you are 51-75 years of age, have your blood pressure checked every 3-5 years. If you are 56 years of age or older, have your blood pressure checked every year. You should have your blood pressure measured twice--once when you are at a hospital or clinic, and once when you are not at a hospital or clinic. Record the average of the two measurements. To check your blood pressure when you are not at a hospital or clinic, you can use:  An automated blood  pressure machine at a pharmacy.  A home blood pressure monitor.  If you are 7-74 years old, ask your health care provider if you should take aspirin to prevent heart disease.  Diabetes screening involves taking a blood sample to check your fasting blood sugar level. This should be done once every 3 years after age 41 if you are at a normal weight and without risk factors for diabetes. Testing should be considered at a younger age or be carried out more frequently if you are overweight and have at least 1 risk factor for diabetes.  Colorectal cancer can be detected and often prevented. Most routine colorectal cancer screening begins at the age of 75 and continues through age 65. However, your health care provider may recommend screening at an earlier age if you have risk factors for colon cancer. On a yearly basis, your health care provider may provide home test kits to check for hidden blood in the stool. A small camera at the end of a tube may be used to directly examine the colon (sigmoidoscopy or colonoscopy) to detect the earliest forms of colorectal cancer. Talk to your health care provider about this at age 99 when routine screening begins. A direct exam of the colon should be repeated every 5-10 years through age 76, unless early forms of precancerous polyps or small growths are found.  People who are at an increased risk for hepatitis B should be screened for this virus. You are considered at high risk for hepatitis B if:  You were born in a country where hepatitis B occurs often. Talk with your health care provider about which countries are considered high risk.  Your parents were born in a high-risk country and you have not received a shot to protect against hepatitis B (hepatitis B vaccine).  You have HIV or AIDS.  You use needles to inject street drugs.  You live with, or have sex with, someone who has hepatitis B.  You are a man who has sex with other men (MSM).  You get  hemodialysis treatment.  You take certain medicines for conditions like cancer, organ transplantation, and autoimmune conditions.  Hepatitis C blood testing is recommended for all people born from [birth year] through 1964 and any individual with known risk factors for hepatitis C.  Healthy men should no longer receive prostate-specific antigen (PSA) blood tests as part of routine cancer screening. Talk to your health care provider about prostate cancer screening.  Testicular cancer screening is not recommended for adolescents or adult males who have no symptoms. Screening includes self-exam, a health care provider exam, and other screening tests. Consult with your health care provider about any symptoms you have or any concerns you have about testicular cancer.  Practice safe sex. Use condoms and avoid high-risk sexual practices to reduce the spread of sexually transmitted infections (STIs).  You should be screened for STIs, including  gonorrhea and chlamydia if:  You are sexually active and are younger than 24 years.  You are older than 24 years, and your health care provider tells you that you are at risk for this type of infection.  Your sexual activity has changed since you were last screened, and you are at an increased risk for chlamydia or gonorrhea. Ask your health care provider if you are at risk.  If you are at risk of being infected with HIV, it is recommended that you take a prescription medicine daily to prevent HIV infection. This is called pre-exposure prophylaxis (PrEP). You are considered at risk if:  You are a man who has sex with other men (MSM).  You are a heterosexual man who is sexually active with multiple partners.  You take drugs by injection.  You are sexually active with a partner who has HIV.  Talk with your health care provider about whether you are at high risk of being infected with HIV. If you choose to begin PrEP, you should first be tested for HIV. You should  then be tested every 3 months for as long as you are taking PrEP.  Use sunscreen. Apply sunscreen liberally and repeatedly throughout the day. You should seek shade when your shadow is shorter than you. Protect yourself by wearing long sleeves, pants, a wide-brimmed hat, and sunglasses year round whenever you are outdoors.  Tell your health care provider of new moles or changes in moles, especially if there is a change in shape or color. Also, tell your health care provider if a mole is larger than the size of a pencil eraser.  A one-time screening for abdominal aortic aneurysm (AAA) and surgical repair of large AAAs by ultrasound is recommended for men aged 15-75 years who are current or former smokers.  Stay current with your vaccines (immunizations).   This information is not intended to replace advice given to you by your health care provider. Make sure you discuss any questions you have with your health care provider.   Document Released: 05/08/2008 Document Revised: 12/01/2014 Document Reviewed: 04/07/2011 Elsevier Interactive Patient Education [employer].

## 2015-10-26 NOTE — Progress Notes (Signed)
Pre visit review using our clinic review tool, if applicable. No additional management support is needed unless otherwise documented below in the visit note. 

## 2015-10-26 NOTE — Assessment & Plan Note (Signed)
Overlying R PIP joint. Check uric acid in family history of gout.

## 2015-10-29 ENCOUNTER — Encounter: Payer: Self-pay | Admitting: *Deleted

## 2016-03-05 ENCOUNTER — Ambulatory Visit (INDEPENDENT_AMBULATORY_CARE_PROVIDER_SITE_OTHER): Payer: BLUE CROSS/BLUE SHIELD | Admitting: Primary Care

## 2016-03-05 ENCOUNTER — Encounter: Payer: Self-pay | Admitting: Primary Care

## 2016-03-05 VITALS — BP 122/78 | HR 98 | Temp 98.3°F | Ht 63.0 in | Wt 148.4 lb

## 2016-03-05 DIAGNOSIS — J020 Streptococcal pharyngitis: Secondary | ICD-10-CM

## 2016-03-05 LAB — POCT RAPID STREP A (OFFICE): Rapid Strep A Screen: POSITIVE — AB

## 2016-03-05 MED ORDER — AMOXICILLIN-POT CLAVULANATE 875-125 MG PO TABS: 1.0000 | ORAL_TABLET | Freq: Two times a day (BID) | ORAL | Status: DC

## 2016-03-05 NOTE — Progress Notes (Signed)
Subjective:    Patient ID: Shane Garza, male    DOB: 03/21/1991, 25 y.o.   MRN: [id number]  HPI  Shane Garza is a 25 year old male who presents today with a chief complaint of sore throat. He also reports cough, fevers (100.4, 100.9 yesterday), chills, headache. His symptoms have been present since yesterday. He endorses a tick bite about 10 days ago, denies rash. His fever this morning was at 100.3. He took tylenol at 9 am this morning with improvements in headache and fevers. Denies sick contacts.   He reports sinus pressure several weeks ago which improved with Mucinex, but continues to experience pressure and is blowing green mucous from nasal cavity. His cough began Monday this week and endorses a productive cough with green sputum.   Review of Systems  Constitutional: Positive for fever, chills and fatigue.  HENT: Positive for sinus pressure and sore throat. Negative for congestion and ear pain.   Respiratory: Positive for cough and shortness of breath.   Cardiovascular: Negative for chest pain.  Allergic/Immunologic: Positive for environmental allergies.  Neurological: Positive for headaches.       Past Medical History  Diagnosis Date  . Seasonal allergic rhinitis   . Recurrent sinusitis   . History of asthma     childhood  . Colitis 2015    [facility] ER, mild iliitis on colonoscopy rec rpt 2016 ([place])    Social History   Social History  . Marital Status: Single    Spouse Name: N/A  . Number of Children: N/A  . Years of Education: N/A   Occupational History  . Not on file.   Social History Main Topics  . Smoking status: Former Smoker    Types: Cigarettes    Quit date: 12/14/2012  . Smokeless tobacco: Current User    Types: Snuff, Chew  . Alcohol Use: Yes  . Drug Use: No  . Sexual Activity: No   Other Topics Concern  . 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 Hernia repair    . Esophagogastroduodenoscopy  01/2014    LA grade A esophagitis and chronic gastritis daily PPI ([place])  . Colonoscopy  01/2014    WNL, biopsy mild iliitis, rpt 1 yr ([place])    Family History  Problem Relation Age of Onset  . Hypertension Father   . Diabetes Paternal Grandfather     No Known Allergies  Current Outpatient Prescriptions on File Prior to Visit  Medication Sig Dispense Refill  . aspirin-acetaminophen-caffeine (EXCEDRIN MIGRAINE) 250-250-65 MG per tablet Take 1 tablet by mouth every 6 (six) hours as needed.      Marland Kitchen. omeprazole (PRILOSEC) 20 MG capsule Take 1 capsule (20 mg total) by mouth daily. 30 capsule 11   No current facility-administered medications on file prior to visit.    BP 122/78 mmHg  Pulse 98  Temp(Src) 98.3 F (36.8 C) (Oral)  Ht 5\' 3"  (1.6 m)  Wt 148 lb 6.4 oz (67.314 kg)  BMI 26.29 kg/m2  SpO2 98%    Objective:   Physical Exam  Constitutional: He appears ill.  HENT:  Right Ear: Tympanic membrane and ear canal normal.  Left Ear: Tympanic membrane and ear canal normal.  Nose: Right sinus exhibits no maxillary sinus tenderness  and no frontal sinus tenderness. Left sinus exhibits no maxillary sinus tenderness and no frontal sinus tenderness.  Mouth/Throat: Posterior oropharyngeal edema and posterior oropharyngeal erythema present. No oropharyngeal exudate.  Neck: Neck supple.  Cardiovascular: Normal rate and regular rhythm.   Pulmonary/Chest: Effort normal and breath sounds normal. He has no wheezes. He has no rales.  Lymphadenopathy:    He has cervical adenopathy.  Skin: Skin is warm and dry.          Assessment & Plan:  Strep Pharyngitis:  Sore throat and fevers x 24 hours. Cough x several days. Some improvement with Tyleonol. Appears ill. Exam with erythema and edema to posterior pharynx. Lungs clear. Rapid  strep: Positive. Start Augmentin course for strep and possible sinusitis. Fluids, rest, return precautions provided. Work note provided.

## 2016-03-05 NOTE — Patient Instructions (Signed)
Your strep test was positive.  Start Augmentin antibiotics. Take 1 tablet by mouth twice daily for 7 days.  Try warm salt gargles, chloraseptic spray for sore throat, and ibuprofen 600 mg three times daily for fevers and sore throat.  Ensure you are staying hydrated with water.  It was a pleasure meeting you!  \Strep Throat Strep throat is a bacterial infection of the throat. Your health care provider may call the infection tonsillitis or pharyngitis, depending on whether there is swelling in the tonsils or at the back of the throat. Strep throat is most common during the cold months of the year in children who are 71-62 years of age, but it can happen during any season in people of any age. This infection is spread from person to person (contagious) through coughing, sneezing, or close contact. CAUSES Strep throat is caused by the bacteria called Streptococcus pyogenes. RISK FACTORS This condition is more likely to develop in:  People who spend time in crowded places where the infection can spread easily.  People who have close contact with someone who has strep throat. SYMPTOMS Symptoms of this condition include:  Fever or chills.   Redness, swelling, or pain in the tonsils or throat.  Pain or difficulty when swallowing.  White or yellow spots on the tonsils or throat.  Swollen, tender glands in the neck or under the jaw.  Red rash all over the body (rare). DIAGNOSIS This condition is diagnosed by performing a rapid strep test or by taking a swab of your throat (throat culture test). Results from a rapid strep test are usually ready in a few minutes, but throat culture test results are available after one or two days. TREATMENT This condition is treated with antibiotic medicine. HOME CARE INSTRUCTIONS Medicines  Take over-the-counter and prescription medicines only as told by your health care provider.  Take your antibiotic as told by your health care provider. Do not  stop taking the antibiotic even if you start to feel better.  Have family members who also have a sore throat or fever tested for strep throat. They may need antibiotics if they have the strep infection. Eating and Drinking  Do not share food, drinking cups, or personal items that could cause the infection to spread to other people.  If swallowing is difficult, try eating soft foods until your sore throat feels better.  Drink enough fluid to keep your urine clear or pale yellow. General Instructions  Gargle with a salt-water mixture 3-4 times per day or as needed. To make a salt-water mixture, completely dissolve -1 tsp of salt in 1 cup of warm water.  Make sure that all household members wash their hands well.  Get plenty of rest.  Stay home from school or work until you have been taking antibiotics for 24 hours.  Keep all follow-up visits as told by your health care provider. This is important. SEEK MEDICAL CARE IF:  The glands in your neck continue to get bigger.  You develop a rash, cough, or earache.  You cough up a thick liquid that is green, yellow-brown, or bloody.  You have pain or discomfort that does not get better with medicine.  Your problems seem to be getting worse rather than better.  You have a fever. SEEK IMMEDIATE MEDICAL CARE IF:  You have new symptoms, such as vomiting, severe headache, stiff or painful neck, chest pain, or shortness of breath.  You have severe throat pain, drooling, or changes in your voice.  You have swelling of the neck, or the skin on the neck becomes red and tender.  You have signs of dehydration, such as fatigue, dry mouth, and decreased urination.  You become increasingly sleepy, or you cannot wake up completely.  Your joints become red or painful.   This information is not intended to replace advice given to you by your health care provider. Make sure you discuss any questions you have with your health care provider.     Document Released: 11/07/2000 Document Revised: 08/01/2015 Document Reviewed: 03/05/2015 Elsevier Interactive Patient Education [employer]2016 Elsevier Inc.

## 2016-03-05 NOTE — Progress Notes (Signed)
Pre visit review using our clinic review tool, if applicable. No additional management support is needed unless otherwise documented below in the visit note. 

## 2016-03-06 NOTE — Addendum Note (Signed)
Addended by: Tawnya CrookSAMBATH, Vickii Volland on: 03/06/2016 04:58 PM   Modules accepted: Orders, SmartSet

## 2016-09-29 ENCOUNTER — Other Ambulatory Visit: Payer: Self-pay

## 2016-09-29 ENCOUNTER — Ambulatory Visit (INDEPENDENT_AMBULATORY_CARE_PROVIDER_SITE_OTHER): Payer: BLUE CROSS/BLUE SHIELD | Admitting: Family Medicine

## 2016-09-29 ENCOUNTER — Telehealth: Payer: Self-pay | Admitting: Family Medicine

## 2016-09-29 ENCOUNTER — Encounter: Payer: Self-pay | Admitting: Family Medicine

## 2016-09-29 DIAGNOSIS — J209 Acute bronchitis, unspecified: Secondary | ICD-10-CM | POA: Diagnosis not present

## 2016-09-29 MED ORDER — HYDROCOD POLST-CPM POLST ER 10-8 MG/5ML PO SUER: 5.0000 mL | Freq: Two times a day (BID) | ORAL | 0 refills | Status: DC | PRN

## 2016-09-29 MED ORDER — DOXYCYCLINE HYCLATE 100 MG PO TABS: 100.0000 mg | ORAL_TABLET | Freq: Two times a day (BID) | ORAL | 0 refills | Status: DC

## 2016-09-29 MED ORDER — PREDNISONE 50 MG PO TABS: ORAL_TABLET | ORAL | 0 refills | Status: DC

## 2016-09-29 NOTE — Patient Instructions (Signed)

## 2016-09-29 NOTE — Progress Notes (Signed)
Subjective:  Patient ID: Shane Garza, male    DOB: 03/27/1991  Age: 25 y.o. MRN: [id number]  CC: Cough/wheezing/chest tightness  HPI:  25 year old male presents with the above complaints.  Patient states that he's not been feeling well since this past Tuesday. His symptoms started with sinus drainage. He subsequently developed a GI bug on Thursday and Friday which was characterized by nausea, vomiting, diarrhea as well as fever. This improved but fever has persisted. He's now developed severe cough. He reports associated wheezing and chest tightness. No known exacerbating or relieving factors. In addition, patient states that he feels very poorly and fatigued. No other associated symptoms. No other complaints at this time.  Social Hx   Social History   Social History  . Marital status: Single    Spouse name: N/A  . Number of children: N/A  . Years of education: N/A   Social History Main Topics  . Smoking status: Former Smoker    Types: Cigarettes    Quit date: 12/14/2012  . Smokeless tobacco: Current User    Types: Snuff, Chew  . Alcohol use Yes  . Drug use: No  . Sexual activity: No   Other Topics Concern  . 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  Respiratory: Positive for cough, chest tightness, shortness of breath and wheezing.    Objective:  BP 114/84   Pulse 77   Temp 99.2 F (37.3 C)   Wt 147 lb (66.7 kg)   SpO2 98%   BMI 26.04 kg/m   BP/Weight 09/29/2016 03/05/2016 10/26/2015  Systolic BP 114 122 110  Diastolic BP 84 78 80  Wt. (Lbs) 147 148.4 140.25  BMI 26.04 26.29 24.85   Physical Exam  Constitutional: He is oriented to person, place, and time.  Appears ill.  HENT:  Mouth/Throat: Oropharynx is clear and moist.  Neck: Neck supple.    Cardiovascular: Normal rate and regular rhythm.   Pulmonary/Chest: Effort normal.  Coarse breath sounds.  Lymphadenopathy:    He has no cervical adenopathy.  Neurological: He is oriented to person, place, and time.  Psychiatric:  Flat affect.  Vitals reviewed.  Lab Results  Component Value Date   WBC 9.5 10/26/2015   HGB 15.6 10/26/2015   HCT 44.9 10/26/2015   PLT 267 10/26/2015   GLUCOSE 79 10/26/2015   CHOL 134 10/26/2015   TRIG 73 10/26/2015   HDL 48 10/26/2015   LDLCALC 71 10/26/2015   ALT 45 10/26/2015   AST 46 (H) 10/26/2015   NA 136 10/26/2015   K 3.9 10/26/2015   CL 102 10/26/2015   CREATININE 0.87 10/26/2015   BUN 19 10/26/2015   CO2 25 10/26/2015   TSH 1.83 04/07/2011    Assessment & Plan:   Problem List Items Addressed This Visit    Acute bronchitis    New problem. Patient likely has viral illness but given persistent fever and patient's parents, treating empirically with doxycycline. Also treated with prednisone and Tussionex.         Meds ordered this encounter  Medications  . doxycycline (VIBRA-TABS) 100 MG tablet    Sig: Take 1 tablet (100 mg total) by mouth 2 (two) times daily.    Dispense:  14 tablet    Refill:  0  .  predniSONE (DELTASONE) 50 MG tablet    Sig: 1 tablet daily x 5 days.    Dispense:  5 tablet    Refill:  0  . chlorpheniramine-HYDROcodone (TUSSIONEX PENNKINETIC ER) 10-8 MG/5ML SUER    Sig: Take 5 mLs by mouth every 12 (twelve) hours as needed.    Dispense:  115 mL    Refill:  0    Follow-up: PRN  Everlene OtherJayce Teddrick Mallari DO [facility] [place]

## 2016-09-29 NOTE — Assessment & Plan Note (Signed)
New problem. Patient likely has viral illness but given persistent fever and patient's parents, treating empirically with doxycycline. Also treated with prednisone and Tussionex.

## 2016-09-29 NOTE — Telephone Encounter (Signed)
[facility] to [facility] [place].

## 2016-09-29 NOTE — Telephone Encounter (Signed)
Pt called stated the pharmacy does not have the prescription for an antibiotic that he prescribed. Please advise, thank you! Pharmacy - [facility] [place], [place] - [address] Call pt @ [phone]

## 2016-10-25 ENCOUNTER — Other Ambulatory Visit: Payer: Self-pay | Admitting: Family Medicine

## 2016-12-23 ENCOUNTER — Telehealth: Payer: Self-pay | Admitting: Family Medicine

## 2016-12-23 DIAGNOSIS — R070 Pain in throat: Secondary | ICD-10-CM | POA: Diagnosis not present

## 2016-12-23 DIAGNOSIS — Z6825 Body mass index (BMI) 25.0-25.9, adult: Secondary | ICD-10-CM | POA: Diagnosis not present

## 2016-12-23 DIAGNOSIS — B950 Streptococcus, group A, as the cause of diseases classified elsewhere: Secondary | ICD-10-CM | POA: Diagnosis not present

## 2016-12-23 NOTE — Telephone Encounter (Signed)
Noted. Possible flu. Agree with [facility] eval.

## 2016-12-23 NOTE — Telephone Encounter (Signed)
Pt called looking for an appt. [facility]. Pt is c/o fever of 103 to 100.3, body aches, and sore throat. Sent call to [facility] triage.

## 2016-12-23 NOTE — Telephone Encounter (Signed)
Patient Name: Shane Garza  DOB: 02/12/1991    Initial Comment Caller states he is running a fever of 103.0, can't get it below 100.0. Body aches and sore throat as well.    Nurse Assessment  Nurse: Annye Englisharmon, RN, Denise Date/Time (Eastern Time): 12/23/2016 1:28:56 PM  Confirm and document reason for call. If symptomatic, describe symptoms. ---Caller states he is running a fever of 103.0, can't get it below 100.0. Body aches and sore throat as well. Pt states [facility] at [facility].  Does the patient have any new or worsening symptoms? ---Yes  Will a triage be completed? ---No  Select reason for no triage. ---Other  Please document clinical information provided and list any resource used. ---[facility]. Advised to cb if further asst needed.     Guidelines    Guideline Title Affirmed Question Affirmed Notes       Final Disposition User

## 2017-02-02 ENCOUNTER — Other Ambulatory Visit: Payer: Self-pay | Admitting: Family Medicine

## 2017-02-03 ENCOUNTER — Encounter: Payer: Self-pay | Admitting: *Deleted

## 2017-03-10 DIAGNOSIS — R350 Frequency of micturition: Secondary | ICD-10-CM | POA: Diagnosis not present

## 2017-03-10 DIAGNOSIS — K219 Gastro-esophageal reflux disease without esophagitis: Secondary | ICD-10-CM | POA: Diagnosis not present

## 2017-03-10 DIAGNOSIS — N433 Hydrocele, unspecified: Secondary | ICD-10-CM | POA: Diagnosis not present

## 2017-03-10 DIAGNOSIS — N50811 Right testicular pain: Secondary | ICD-10-CM | POA: Diagnosis not present

## 2017-03-10 DIAGNOSIS — J45909 Unspecified asthma, uncomplicated: Secondary | ICD-10-CM | POA: Diagnosis not present

## 2017-03-10 DIAGNOSIS — Z79899 Other long term (current) drug therapy: Secondary | ICD-10-CM | POA: Diagnosis not present

## 2017-03-10 DIAGNOSIS — Z6825 Body mass index (BMI) 25.0-25.9, adult: Secondary | ICD-10-CM | POA: Diagnosis not present

## 2017-03-10 DIAGNOSIS — Z7982 Long term (current) use of aspirin: Secondary | ICD-10-CM | POA: Diagnosis not present

## 2017-05-28 DIAGNOSIS — S0501XA Injury of conjunctiva and corneal abrasion without foreign body, right eye, initial encounter: Secondary | ICD-10-CM | POA: Diagnosis not present

## 2017-08-24 NOTE — Telephone Encounter (Signed)
error 

## 2017-12-02 DIAGNOSIS — J45909 Unspecified asthma, uncomplicated: Secondary | ICD-10-CM | POA: Diagnosis not present

## 2017-12-02 DIAGNOSIS — R05 Cough: Secondary | ICD-10-CM | POA: Diagnosis not present

## 2017-12-02 DIAGNOSIS — Z6831 Body mass index (BMI) 31.0-31.9, adult: Secondary | ICD-10-CM | POA: Diagnosis not present

## 2017-12-02 DIAGNOSIS — Z87891 Personal history of nicotine dependence: Secondary | ICD-10-CM | POA: Diagnosis not present

## 2017-12-02 DIAGNOSIS — R0789 Other chest pain: Secondary | ICD-10-CM | POA: Diagnosis not present

## 2018-02-05 DIAGNOSIS — R6889 Other general symptoms and signs: Secondary | ICD-10-CM | POA: Diagnosis not present

## 2018-02-05 DIAGNOSIS — Z6830 Body mass index (BMI) 30.0-30.9, adult: Secondary | ICD-10-CM | POA: Diagnosis not present

## 2018-06-02 DIAGNOSIS — Z87891 Personal history of nicotine dependence: Secondary | ICD-10-CM | POA: Diagnosis not present

## 2018-06-02 DIAGNOSIS — J0410 Acute tracheitis without obstruction: Secondary | ICD-10-CM | POA: Diagnosis not present

## 2018-06-02 DIAGNOSIS — Z6830 Body mass index (BMI) 30.0-30.9, adult: Secondary | ICD-10-CM | POA: Diagnosis not present

## 2018-06-02 DIAGNOSIS — R070 Pain in throat: Secondary | ICD-10-CM | POA: Diagnosis not present

## 2018-06-02 DIAGNOSIS — J45909 Unspecified asthma, uncomplicated: Secondary | ICD-10-CM | POA: Diagnosis not present

## 2018-06-02 DIAGNOSIS — J029 Acute pharyngitis, unspecified: Secondary | ICD-10-CM | POA: Diagnosis not present

## 2018-06-08 ENCOUNTER — Ambulatory Visit: Payer: BLUE CROSS/BLUE SHIELD | Admitting: Family Medicine

## 2018-06-08 ENCOUNTER — Encounter: Payer: Self-pay | Admitting: Family Medicine

## 2018-06-08 VITALS — BP 132/82 | HR 106 | Temp 98.6°F | Ht 63.0 in | Wt 182.5 lb

## 2018-06-08 DIAGNOSIS — E0789 Other specified disorders of thyroid: Secondary | ICD-10-CM | POA: Diagnosis not present

## 2018-06-08 DIAGNOSIS — J029 Acute pharyngitis, unspecified: Secondary | ICD-10-CM | POA: Diagnosis not present

## 2018-06-08 LAB — POCT RAPID STREP A (OFFICE): Rapid Strep A Screen: NEGATIVE

## 2018-06-08 MED ORDER — AMOXICILLIN-POT CLAVULANATE 875-125 MG PO TABS: 1.0000 | ORAL_TABLET | Freq: Two times a day (BID) | ORAL | 0 refills | Status: AC

## 2018-06-08 NOTE — Progress Notes (Signed)
BP 132/82 (BP Location: Right Arm, Patient Position: Sitting, Cuff Size: Normal)   Pulse (!) 106   Temp 98.6 F (37 C) (Oral)   Ht 5\' 3"  (1.6 m)   Wt 182 lb 8 oz (82.8 kg)   SpO2 97%   BMI 32.33 kg/m    CC: ST Subjective:    Patient ID: Shane Garza, male    DOB: 12/17/1990, 27 y.o.   MRN: [id number]  HPI: Shane Garza is a 27 y.o. male presenting on 06/08/2018 for Sore Throat (C/o sore throat for more than a month. Was seen at [facility] in [place] last wk. Given abx and prednisone, initially felt better. But felt bad again by 06/07/18. Suggested pt see PCP.  )   1 mo h/o ST associated with PNdrainage and neck fullness sensation, hacking up phlegm. Headache. Earaches. Some increased fatigue.   No significant cough. No fevers/chills, abd pain, nausea, dyspnea or wheezing.  No hoarseness or trismus.   Has tried pseudophed without improvement. Also taking ibuprofen and benadryl. Seen at [facility] - dx with tracheitis, treated with azithromycin course and 40mg  prednisone x1. He did feel improvement initially, but symptoms recurred 2 days ago. Rapid strep and throat culture negative for strep.   No sick contacts at home.  No smokers at home. Quit 2017.  Childhood asthma.   Relevant past medical, surgical, family and social history reviewed and updated as indicated. Interim medical history since our last visit reviewed. Allergies and medications reviewed and updated. Outpatient Medications Prior to Visit  Medication Sig Dispense Refill  . aspirin-acetaminophen-caffeine (EXCEDRIN MIGRAINE) 250-250-65 MG per tablet Take 1 tablet by mouth every 6 (six) hours as needed.      Marland Kitchen esomeprazole (NEXIUM) 20 MG capsule Take 20 mg by mouth daily at 12 noon.    Marland Kitchen levocetirizine (XYZAL) 5 MG tablet Take 5 mg by mouth every evening.    . Multiple Vitamins-Minerals (MULTIVITAMIN ADULT PO) Take 1 tablet by mouth daily.    Marland Kitchen triamcinolone (NASACORT ALLERGY 24HR) 55 MCG/ACT AERO nasal inhaler Place 1 spray  into the nose daily.    . chlorpheniramine-HYDROcodone (TUSSIONEX PENNKINETIC ER) 10-8 MG/5ML SUER Take 5 mLs by mouth every 12 (twelve) hours as needed. 115 mL 0  . doxycycline (VIBRA-TABS) 100 MG tablet Take 1 tablet (100 mg total) by mouth 2 (two) times daily. 14 tablet 0  . omeprazole (PRILOSEC) 20 MG capsule take 1 capsule by mouth once daily 30 capsule 0  . predniSONE (DELTASONE) 50 MG tablet 1 tablet daily x 5 days. 5 tablet 0   No facility-administered medications prior to visit.      Per HPI unless specifically indicated in ROS section below Review of Systems     Objective:    BP 132/82 (BP Location: Right Arm, Patient Position: Sitting, Cuff Size: Normal)   Pulse (!) 106   Temp 98.6 F (37 C) (Oral)   Ht 5\' 3"  (1.6 m)   Wt 182 lb 8 oz (82.8 kg)   SpO2 97%   BMI 32.33 kg/m   Wt Readings from Last 3 Encounters:  06/08/18 182 lb 8 oz (82.8 kg)  09/29/16 147 lb (66.7 kg)  03/05/16 148 lb 6.4 oz (67.3 kg)    Physical Exam  Constitutional: He appears well-developed and well-nourished. No distress.  HENT:  Head: Normocephalic and atraumatic.  Right Ear: Hearing, tympanic membrane, external ear and ear canal normal.  Left Ear: Hearing, tympanic membrane, external ear and ear canal normal.  Nose: Nose normal. No mucosal edema or rhinorrhea. Right sinus exhibits no maxillary sinus tenderness and no frontal sinus tenderness. Left sinus exhibits no maxillary sinus tenderness and no frontal sinus tenderness.  Mouth/Throat: Uvula is midline and mucous membranes are normal. Oropharyngeal exudate (L sided), posterior oropharyngeal edema and posterior oropharyngeal erythema present. No tonsillar abscesses.  Eyes: Pupils are equal, round, and reactive to light. Conjunctivae and EOM are normal. No scleral icterus.  Neck: Normal range of motion. Neck supple.  Tender along left thyroid without enlargement No obvious cervical LAD  Cardiovascular: Regular rhythm, normal heart sounds and  intact distal pulses. Tachycardia present.  No murmur heard. Pulmonary/Chest: Effort normal and breath sounds normal. No respiratory distress. He has no wheezes. He has no rales.  Abdominal: Soft. Normal appearance. There is no hepatosplenomegaly. There is no tenderness.  Lymphadenopathy:    He has no cervical adenopathy.  Skin: Skin is warm and dry. No rash noted.  Nursing note and vitals reviewed.  Results for orders placed or performed in visit on 06/08/18  POCT rapid strep A  Result Value Ref Range   Rapid Strep A Screen Negative Negative   Lab Results  Component Value Date   TSH 1.83 04/07/2011       Assessment & Plan:   Problem List Items Addressed This Visit    Acute pharyngitis, unspecified - Primary    Ongoing symptoms. RST negative. Check monospot and CBC and thyroid function given tenderness along thyroid. Symptoms did improve with initial abx course and prednisone dose x1.  Given prolonged duration of symptoms and point tenderness along left neck, will cover for bacterial superinfection with augmentin course.  In interim, schedule ibuprofen, supportive care reviewed. We will be in touch with patient for results      Relevant Orders   Mononucleosis screen   CBC with Differential/Platelet    Other Visit Diagnoses    Sore throat       Relevant Orders   POCT rapid strep A (Completed)   Painful thyroid       Relevant Orders   CBC with Differential/Platelet   TSH   T4, free       Meds ordered this encounter  Medications  . amoxicillin-clavulanate (AUGMENTIN) 875-125 MG tablet    Sig: Take 1 tablet by mouth 2 (two) times daily for 10 days.    Dispense:  20 tablet    Refill:  0   Orders Placed This Encounter  Procedures  . Mononucleosis screen  . CBC with Differential/Platelet  . TSH  . T4, free  . POCT rapid strep A    Follow up plan: No follow-ups on file.  Eustaquio BoydenJavier Tamiki Kuba, MD

## 2018-06-08 NOTE — Assessment & Plan Note (Addendum)
Ongoing symptoms. RST negative. Check monospot and CBC and thyroid function given tenderness along thyroid. Symptoms did improve with initial abx course and prednisone dose x1.  Given prolonged duration of symptoms and point tenderness along left neck, will cover for bacterial superinfection with augmentin course.  In interim, schedule ibuprofen, supportive care reviewed. We will be in touch with patient for results

## 2018-06-08 NOTE — Patient Instructions (Addendum)
Double check that throat culture was negative at urgent care Labs today - we will be in touch with results. Rapid strep negative. You have ongoing pharyngitis. Push fluids and plenty of rest. May use ibuprofen 600mg  scheduled with meals for throat inflammation. Salt water gargles. Suck on cold things like popsicles or warm things like herbal teas (whichever soothes the throat better). Let us know if not improving with this.

## 2018-06-09 LAB — T4, FREE: Free T4: 1.21 ng/dL (ref 0.60–1.60)

## 2018-06-09 LAB — CBC WITH DIFFERENTIAL/PLATELET
Basophils Absolute: 0.1 10*3/uL (ref 0.0–0.1)
Basophils Relative: 0.7 % (ref 0.0–3.0)
Eosinophils Absolute: 0.2 10*3/uL (ref 0.0–0.7)
Eosinophils Relative: 1.2 % (ref 0.0–5.0)
HCT: 43.7 % (ref 39.0–52.0)
Hemoglobin: 15.2 g/dL (ref 13.0–17.0)
Lymphocytes Relative: 18.7 % (ref 12.0–46.0)
Lymphs Abs: 2.4 10*3/uL (ref 0.7–4.0)
MCHC: 34.7 g/dL (ref 30.0–36.0)
MCV: 92.9 fl (ref 78.0–100.0)
Monocytes Absolute: 1 10*3/uL (ref 0.1–1.0)
Monocytes Relative: 7.7 % (ref 3.0–12.0)
Neutro Abs: 9.3 10*3/uL — ABNORMAL HIGH (ref 1.4–7.7)
Neutrophils Relative %: 71.7 % (ref 43.0–77.0)
Platelets: 321 10*3/uL (ref 150.0–400.0)
RBC: 4.71 Mil/uL (ref 4.22–5.81)
RDW: 12.6 % (ref 11.5–15.5)
WBC: 13 10*3/uL — ABNORMAL HIGH (ref 4.0–10.5)

## 2018-06-09 LAB — MONONUCLEOSIS SCREEN: Mono Screen: POSITIVE — AB

## 2018-06-09 LAB — TSH: TSH: 0.01 u[IU]/mL — ABNORMAL LOW (ref 0.35–4.50)

## 2018-06-10 ENCOUNTER — Encounter: Payer: Self-pay | Admitting: Family Medicine

## 2018-06-10 ENCOUNTER — Telehealth: Payer: Self-pay

## 2018-06-10 DIAGNOSIS — B2790 Infectious mononucleosis, unspecified without complication: Secondary | ICD-10-CM

## 2018-06-10 DIAGNOSIS — R7989 Other specified abnormal findings of blood chemistry: Secondary | ICD-10-CM

## 2018-06-10 DIAGNOSIS — E038 Other specified hypothyroidism: Secondary | ICD-10-CM | POA: Insufficient documentation

## 2018-06-10 HISTORY — DX: Infectious mononucleosis, unspecified without complication: B27.90

## 2018-06-10 NOTE — Telephone Encounter (Signed)
Copied from CRM [id number]. Topic: Inquiry >> Jun 10, 2018  3:44 PM [name], Taylor L, NT wrote: Reason for CRM: patient is calling and requesting lab results from 06/08/18. Please call back. [phone]

## 2018-06-10 NOTE — Telephone Encounter (Signed)
See result note.  

## 2018-06-11 ENCOUNTER — Telehealth: Payer: Self-pay | Admitting: Family Medicine

## 2018-06-11 ENCOUNTER — Encounter: Payer: Self-pay | Admitting: Family Medicine

## 2018-06-11 NOTE — Addendum Note (Signed)
Addended by: Eustaquio BoydenGUTIERREZ, Gudelia Eugene on: 06/11/2018 01:25 PM   Modules accepted: Orders

## 2018-06-11 NOTE — Telephone Encounter (Signed)
Tried to call, no answer.  Would recommend continue augmentin antibiotic. Need to caution patient to watch for possible development of rash on augmentin with mono.

## 2018-06-11 NOTE — Telephone Encounter (Signed)
Will ask to return in [id number] to recheck TFTs

## 2018-06-11 NOTE — Telephone Encounter (Signed)
Pt given results per notes of Dr Sharen HonesGutierrez on 06/10/18.Unable to document in result note due to result note not being routed to [facility]. Pt states he  Has a sore throat, headache feels achy all over and today his stomach aches.

## 2018-06-11 NOTE — Telephone Encounter (Signed)
I was not able to addend the result note with information but will forward on to Dr. Sharen HonesGutierrez in response to his request for f/u based on positive mononucleosis test.  See patient's symptoms in below message string.

## 2018-06-11 NOTE — Telephone Encounter (Signed)
Spoke with patient. Discussed mono Feeling some better with current treatment.  discussed monitoring for rash on augmentin. discussed avoiding contact sports.

## 2018-07-12 ENCOUNTER — Other Ambulatory Visit (INDEPENDENT_AMBULATORY_CARE_PROVIDER_SITE_OTHER): Payer: BLUE CROSS/BLUE SHIELD

## 2018-07-12 DIAGNOSIS — R7989 Other specified abnormal findings of blood chemistry: Secondary | ICD-10-CM | POA: Diagnosis not present

## 2018-07-13 LAB — T4, FREE: Free T4: 0.52 ng/dL — ABNORMAL LOW (ref 0.60–1.60)

## 2018-07-13 LAB — TSH: TSH: 0.23 u[IU]/mL — ABNORMAL LOW (ref 0.35–4.50)

## 2018-07-14 ENCOUNTER — Encounter: Payer: Self-pay | Admitting: Family Medicine

## 2018-07-14 DIAGNOSIS — E038 Other specified hypothyroidism: Secondary | ICD-10-CM

## 2018-07-16 NOTE — Telephone Encounter (Signed)
Spoke with patient. Concern for central hypothyroidism. Will check MR brain then determine need for further labs vs endo referral vs other.

## 2018-07-29 ENCOUNTER — Telehealth: Payer: Self-pay

## 2018-07-29 NOTE — Telephone Encounter (Signed)
Just wanted to let you know that patient called back and changed his MRI location from [facility] to [facility] due to the cost per his insurance. Order faxed over and appointment made for 08/02/18 at 8:30 am, [facility] advised to send [place] the results-Latavius Capizzi V Kush Farabee, RMA

## 2018-08-04 ENCOUNTER — Ambulatory Visit: Payer: BLUE CROSS/BLUE SHIELD

## 2018-08-07 NOTE — Telephone Encounter (Signed)
Can we touch base with patient or [facility]?  I never received results.Marland Kitchen..Marland Kitchen

## 2018-08-09 NOTE — Telephone Encounter (Signed)
Called [facility] and had to leave a message in the medical records department to get result sent to Shane Garza-Shane Garza Shane Garza, RMA

## 2018-08-09 NOTE — Telephone Encounter (Signed)
Called patient but there was no answer. Called [facility] back and patient is scheduled for 08/13/18 now for his MRI. It was rescheduled-Taleia Sadowski Ander PurpuraV Turhan Chill, RMA

## 2018-08-13 ENCOUNTER — Encounter: Payer: Self-pay | Admitting: Family Medicine

## 2018-08-13 DIAGNOSIS — Z135 Encounter for screening for eye and ear disorders: Secondary | ICD-10-CM | POA: Diagnosis not present

## 2018-08-13 DIAGNOSIS — E038 Other specified hypothyroidism: Secondary | ICD-10-CM | POA: Diagnosis not present

## 2018-08-16 ENCOUNTER — Telehealth: Payer: Self-pay | Admitting: Family Medicine

## 2018-08-16 DIAGNOSIS — Q892 Congenital malformations of other endocrine glands: Secondary | ICD-10-CM | POA: Insufficient documentation

## 2018-08-16 DIAGNOSIS — E038 Other specified hypothyroidism: Secondary | ICD-10-CM

## 2018-08-16 NOTE — Telephone Encounter (Signed)
See below

## 2018-08-16 NOTE — Telephone Encounter (Signed)
Spoke with patient regarding MRI obtained at [facility] - MRI suggestive of mild pituitary hypoplasia. Found after labs returned showing central hypothyroidism. Will refer to endo for further hormonal workup and possible commencement of thyroid medication.

## 2018-08-17 NOTE — Telephone Encounter (Signed)
Spoke with patient. S[facility] for review and that their office will reach out to patient directly to schedule. Patient aware of all the information-Aneesa Romey V Maxamus Colao, RMA

## 2018-09-17 DIAGNOSIS — R946 Abnormal results of thyroid function studies: Secondary | ICD-10-CM | POA: Diagnosis not present

## 2018-11-18 DIAGNOSIS — L600 Ingrowing nail: Secondary | ICD-10-CM | POA: Diagnosis not present

## 2018-11-18 DIAGNOSIS — M7751 Other enthesopathy of right foot: Secondary | ICD-10-CM | POA: Diagnosis not present

## 2018-12-06 ENCOUNTER — Encounter: Payer: Self-pay | Admitting: Family Medicine

## 2018-12-06 ENCOUNTER — Telehealth: Payer: Self-pay

## 2018-12-06 ENCOUNTER — Ambulatory Visit: Payer: BLUE CROSS/BLUE SHIELD | Admitting: Family Medicine

## 2018-12-06 VITALS — BP 126/86 | HR 83 | Temp 97.9°F | Ht 63.0 in | Wt 190.8 lb

## 2018-12-06 DIAGNOSIS — R059 Cough, unspecified: Secondary | ICD-10-CM

## 2018-12-06 DIAGNOSIS — R05 Cough: Secondary | ICD-10-CM | POA: Diagnosis not present

## 2018-12-06 DIAGNOSIS — J22 Unspecified acute lower respiratory infection: Secondary | ICD-10-CM | POA: Insufficient documentation

## 2018-12-06 LAB — POC INFLUENZA A&B (BINAX/QUICKVUE)
Influenza A, POC: NEGATIVE
Influenza B, POC: NEGATIVE

## 2018-12-06 NOTE — Progress Notes (Signed)
BP 126/86 (BP Location: Left Arm, Patient Position: Sitting, Cuff Size: Normal)   Pulse 83   Temp 97.9 F (36.6 C) (Oral)   Ht 5\' 3"  (1.6 m)   Wt 190 lb 12 oz (86.5 kg)   SpO2 98%   BMI 33.79 kg/m    CC: cough Subjective:    Patient ID: Shane Garza, male    DOB: 12/11/1990, 28 y.o.   MRN: [id number]  HPI: Shane Garza is a 28 y.o. male presenting on 12/06/2018 for Cough (C/o productive cough with green mucous, fatigue, itchy rash and sweaty. Sxs started 1 wk ago. Pt concerned due to pregnant wife at home.)   1 wk h/o malaise, fatigue, productive cough of green mucous, sweats, body aches. Noted new itchy rash this morning. + ST and PNDrainage. Some dyspnea. Head > chest congestion. He is sleeping well.   No fevers/chills, ear or tooth pain, wheezing.   Recently completed augmentin course for ingrown toenail on right.   He did have prolonged fatigue and slow recovery after mono illness last year.  Known allergic rhinitis. Childhood asthma.  Non smoker.  Hasn't tried anything for this yet.  Hasn't received flu shot yet.  No sick contacts at home.  Wife is pregnant.   [facility] - grandfather just had MI.      Relevant past medical, surgical, family and social history reviewed and updated as indicated. Interim medical history since our last visit reviewed. Allergies and medications reviewed and updated. Outpatient Medications Prior to Visit  Medication Sig Dispense Refill  . aspirin-acetaminophen-caffeine (EXCEDRIN MIGRAINE) 250-250-65 MG per tablet Take 1 tablet by mouth every 6 (six) hours as needed.      Marland Kitchen. esomeprazole (NEXIUM) 20 MG capsule Take 20 mg by mouth daily at 12 noon.    Marland Kitchen. levocetirizine (XYZAL) 5 MG tablet Take 5 mg by mouth every evening.    . Multiple Vitamin (MULTIVITAMIN) capsule Take by mouth daily.    . Multiple Vitamins-Minerals (MULTIVITAMIN ADULT PO) Take 1 tablet by mouth daily.    . Probiotic Product (PROBIOTIC DAILY  PO) Take by mouth daily.    Marland Kitchen. triamcinolone (NASACORT ALLERGY 24HR) 55 MCG/ACT AERO nasal inhaler Place 1 spray into the nose daily.     No facility-administered medications prior to visit.      Per HPI unless specifically indicated in ROS section below Review of Systems Objective:    BP 126/86 (BP Location: Left Arm, Patient Position: Sitting, Cuff Size: Normal)   Pulse 83   Temp 97.9 F (36.6 C) (Oral)   Ht 5\' 3"  (1.6 m)   Wt 190 lb 12 oz (86.5 kg)   SpO2 98%   BMI 33.79 kg/m   Wt Readings from Last 3 Encounters:  12/06/18 190 lb 12 oz (86.5 kg)  06/08/18 182 lb 8 oz (82.8 kg)  09/29/16 147 lb (66.7 kg)    Physical Exam Vitals signs and nursing note reviewed.  Constitutional:      General: He is not in acute distress.    Appearance: He is well-developed.  HENT:     Head: Normocephalic and atraumatic.     Right Ear: Hearing, tympanic membrane, ear canal and external ear normal.     Left Ear: Hearing, tympanic membrane, ear canal and external ear normal.     Nose: Congestion present. No mucosal edema or rhinorrhea.     Right Sinus: No maxillary sinus tenderness or frontal sinus tenderness.  Left Sinus: No maxillary sinus tenderness or frontal sinus tenderness.     Mouth/Throat:     Mouth: Mucous membranes are moist.     Pharynx: Uvula midline. Pharyngeal swelling and posterior oropharyngeal erythema (marked post oropharyngeal) present. No oropharyngeal exudate.     Tonsils: No tonsillar exudate or tonsillar abscesses.  Eyes:     General: No scleral icterus.    Conjunctiva/sclera: Conjunctivae normal.     Pupils: Pupils are equal, round, and reactive to light.  Neck:     Musculoskeletal: Normal range of motion and neck supple.  Cardiovascular:     Rate and Rhythm: Normal rate and regular rhythm.     Heart sounds: Normal heart sounds. No murmur.  Pulmonary:     Effort: Pulmonary effort is normal. No respiratory distress.     Breath sounds: Normal breath sounds. No  wheezing or rales.     Comments: Lungs clear Lymphadenopathy:     Cervical: No cervical adenopathy.  Skin:    General: Skin is warm and dry.     Findings: Erythema and rash present.     Comments: Erythematous pruritic macular rash bilateral popliteal region       Results for orders placed or performed in visit on 12/06/18  POC Influenza A&B(BINAX/QUICKVUE)  Result Value Ref Range   Influenza A, POC Negative Negative   Influenza B, POC Negative Negative   Assessment & Plan:   Problem List Items Addressed This Visit    Acute respiratory infection - Primary    Flu swab was negative Anticipate viral illness. Supportive care reviewed as per pt instructions. Red flags to seek further care reviewed.  He did recently complete augmentin course for infected ingrown toenail.       Relevant Orders   POC Influenza A&B(BINAX/QUICKVUE) (Completed)    Other Visit Diagnoses    Cough       Relevant Orders   POC Influenza A&B(BINAX/QUICKVUE) (Completed)       No orders of the defined types were placed in this encounter.  Orders Placed This Encounter  Procedures  . POC Influenza A&B(BINAX/QUICKVUE)   Patient Instructions  You have a viral upper respiratory infection. Flu swab was negative.  Antibiotics are not needed for this.  Viral infections usually take 7-10 days to resolve.  The cough can last a few weeks to go away. Push fluids and plenty of rest.  May try ibuprofen 400-600mg  with meals for next 3-5 days.  Plain mucinex with plenty of water to help mobilize mucous.  Take some delsym or robitussin over the counter cough syrup.  Please return if you are not improving as expected, or if you have high fevers (>101.5) or difficulty swallowing or worsening productive cough. Call clinic with questions.  Good to see you today. I hope you start feeling better soon.    Follow up plan: Return if symptoms worsen or fail to improve.  Eustaquio Boyden, MD

## 2018-12-06 NOTE — Telephone Encounter (Signed)
Pt called in with on and off SOB today, prod cough with green-yellow phlegm,no CP, wheezing, fever and pt is in no distress. pts wife is pregnant and he does not want to make her sick. Pt scheduled appt with Dr Reece AgarG 12/06/18 at 10:15. [place] [facility]FYI to Dr Reece AgarG.

## 2018-12-06 NOTE — Assessment & Plan Note (Addendum)
Flu swab was negative Anticipate viral illness. Supportive care reviewed as per pt instructions. Red flags to seek further care reviewed.  He did recently complete augmentin course for infected ingrown toenail.

## 2018-12-06 NOTE — Telephone Encounter (Signed)
Noted  

## 2018-12-06 NOTE — Patient Instructions (Signed)
You have a viral upper respiratory infection. Flu swab was negative.  Antibiotics are not needed for this.  Viral infections usually take 7-10 days to resolve.  The cough can last a few weeks to go away. Push fluids and plenty of rest.  May try ibuprofen 400-600mg  with meals for next 3-5 days.  Plain mucinex with plenty of water to help mobilize mucous.  Take some delsym or robitussin over the counter cough syrup.  Please return if you are not improving as expected, or if you have high fevers (>101.5) or difficulty swallowing or worsening productive cough. Call clinic with questions.  Good to see you today. I hope you start feeling better soon.

## 2018-12-08 DIAGNOSIS — R109 Unspecified abdominal pain: Secondary | ICD-10-CM | POA: Diagnosis not present

## 2018-12-08 DIAGNOSIS — N451 Epididymitis: Secondary | ICD-10-CM | POA: Diagnosis not present

## 2018-12-08 DIAGNOSIS — Z6830 Body mass index (BMI) 30.0-30.9, adult: Secondary | ICD-10-CM | POA: Diagnosis not present

## 2018-12-08 LAB — CBC AND DIFFERENTIAL
Hemoglobin: 16 (ref 13.5–17.5)
WBC: 9.5

## 2018-12-08 LAB — TSH: TSH: 5.62 (ref 0.41–5.90)

## 2018-12-08 LAB — HEPATIC FUNCTION PANEL
ALT: 83 — AB (ref 10–40)
AST: 43 — AB (ref 14–40)
Alkaline Phosphatase: 101 (ref 25–125)
Bilirubin, Total: 0.7

## 2018-12-08 LAB — BASIC METABOLIC PANEL
Creatinine: 0.8 (ref 0.6–1.3)
Glucose: 91

## 2018-12-09 ENCOUNTER — Encounter: Payer: Self-pay | Admitting: Family Medicine

## 2018-12-09 DIAGNOSIS — N451 Epididymitis: Secondary | ICD-10-CM | POA: Insufficient documentation

## 2018-12-10 ENCOUNTER — Encounter: Payer: Self-pay | Admitting: Family Medicine

## 2019-03-10 ENCOUNTER — Telehealth: Payer: Self-pay

## 2019-03-10 ENCOUNTER — Other Ambulatory Visit: Payer: Self-pay

## 2019-03-10 ENCOUNTER — Encounter: Payer: Self-pay | Admitting: Family Medicine

## 2019-03-10 ENCOUNTER — Ambulatory Visit (INDEPENDENT_AMBULATORY_CARE_PROVIDER_SITE_OTHER): Payer: BLUE CROSS/BLUE SHIELD | Admitting: Family Medicine

## 2019-03-10 VITALS — BP 137/90 | HR 78 | Temp 98.3°F | Ht 63.0 in | Wt 180.0 lb

## 2019-03-10 DIAGNOSIS — J029 Acute pharyngitis, unspecified: Secondary | ICD-10-CM

## 2019-03-10 DIAGNOSIS — J301 Allergic rhinitis due to pollen: Secondary | ICD-10-CM | POA: Diagnosis not present

## 2019-03-10 MED ORDER — PREDNISONE 10 MG PO TABS: ORAL_TABLET | ORAL | 0 refills | Status: DC

## 2019-03-10 NOTE — Telephone Encounter (Signed)
Will see then. 

## 2019-03-10 NOTE — Progress Notes (Signed)
Virtual visit completed through [employer]. Due to national recommendations of social distancing due to COVID 19, a virtual visit is felt to be most appropriate for this patient at this time.   Patient location: home Provider location: [place] at [facility], office If any vitals were documented, they were collected by patient at home unless specified below.    BP 137/90 (BP Location: Right Arm, Patient Position: Sitting, Cuff Size: Normal)   Pulse 78   Temp 98.3 F (36.8 C) (Oral)   Ht 5\' 3"  (1.6 m)   Wt 180 lb (81.6 kg)   BMI 31.89 kg/m    CC: cough, ST Subjective:    Patient ID: CAMDEN KOETH, male    DOB: [birth year], 28 y.o.   MRN: [id number]  HPI: JACKSTON MONTANEZ is a 28 y.o. male presenting on 03/10/2019 for Cough (States he has a mild cough due to allergies. Also, has minor sore throat with a couple of white spots at back of throat. Sxs started about 1 wk ago. Job asked pt to contact doctor. Takes Xyzal and Nasacort. Also, tried Afrin. )   Known h/o seasonal allergies (on afrin, xyzal, nasacort) as well as pharyngitis.   2 wk ago after mowing grass felt raw sore throat, PNdrainage, mildly productive cough. Mild chest tightness. Some HA around eyes, muffled ears.   No fevers/chills, wheezing, tooth pain, body aches. Sense of taste and smell intact.  H/o childhood asthma.   Expecting baby girl - due date 05/26/2019.      Relevant past medical, surgical, family and social history reviewed and updated as indicated. Interim medical history since our last visit reviewed. Allergies and medications reviewed and updated. Outpatient Medications Prior to Visit  Medication Sig Dispense Refill  . levocetirizine (XYZAL) 5 MG tablet Take 5 mg by mouth every evening.    . triamcinolone (NASACORT ALLERGY 24HR) 55 MCG/ACT AERO nasal inhaler Place 1 spray into the nose daily.    Marland Kitchen aspirin-acetaminophen-caffeine (EXCEDRIN MIGRAINE) 250-250-65 MG per tablet Take 1 tablet by mouth every 6  (six) hours as needed.      Marland Kitchen esomeprazole (NEXIUM) 20 MG capsule Take 20 mg by mouth daily at 12 noon.    . Multiple Vitamin (MULTIVITAMIN) capsule Take by mouth daily.    . Multiple Vitamins-Minerals (MULTIVITAMIN ADULT PO) Take 1 tablet by mouth daily.    . Probiotic Product (PROBIOTIC DAILY PO) Take by mouth daily.     No facility-administered medications prior to visit.      Per HPI unless specifically indicated in ROS section below Review of Systems Objective:    BP 137/90 (BP Location: Right Arm, Patient Position: Sitting, Cuff Size: Normal)   Pulse 78   Temp 98.3 F (36.8 C) (Oral)   Ht 5\' 3"  (1.6 m)   Wt 180 lb (81.6 kg)   BMI 31.89 kg/m   Wt Readings from Last 3 Encounters:  03/10/19 180 lb (81.6 kg)  12/06/18 190 lb 12 oz (86.5 kg)  06/08/18 182 lb 8 oz (82.8 kg)     Physical exam: Gen: alert, NAD, not ill appearing HEENT - shines flashlight into throat - no obvious erythema/edema of tonsils but there seems to be L tosillolith present Pulm: speaks in complete sentences without increased work of breathing Psych: normal mood, normal thought content       Assessment & Plan:   Problem List Items Addressed This Visit    Sore throat - Primary    Anticipate allergic cause vs  viral. There appears to be a L tonsillolith present as well. Supportive care reviewed. Rx low dose prednisone taper for allergic symptoms, supportive care reviewed including allergen avoidance. Doubt Covid19. Will keep out of work for next 2 days with return to work on Monday. Red flags to notify us or seek further care reviewed. Pt agrees with plan.       ALLERGIC RHINITIS, SEASONAL       Meds ordered this encounter  Medications  . predniSONE (DELTASONE) 10 MG tablet    Sig: Take three tablets for 2 days followed by two tablets for 2 days followed by one tablet for 2 days    Dispense:  12 tablet    Refill:  0   No orders of the defined types were placed in this encounter.   Follow up  plan: No follow-ups on file.  Eustaquio BoydenJavier Kani Jobson, MD

## 2019-03-10 NOTE — Telephone Encounter (Signed)
[facility] Night - Client TELEPHONE ADVICE RECORD [facility] Patient Name: Mariana ArnFRANKLIN Rosiak Gender: Male DOB: 04/23/1991 Age: 28 Y 4 M 6 D Return Phone Number: [phone] (Primary) Address: City/State/Zip: [place] Client [facility] - Client [facility] - Night Physician Eustaquio BoydenGutierrez, Javier - MD Contact Type Call Who Is Calling Patient / Member / Family / Caregiver Call Type Triage / Clinical Relationship To Patient Self Return Phone Number [phone] (Primary) Chief Complaint Cough Reason for Call Symptomatic / Request for Health Information Initial Comment Caller states he works in [employer] and he has seasonal allergies. States he has a small cough but the site where he is working is very strict about people working that are showing any symptoms of COVID-19. Translation No Nurse Assessment Nurse: Melanee SpryNewhart, RN, Beth Date/Time (Eastern Time): 03/09/2019 5:07:51 PM Confirm and document reason for call. If symptomatic, describe symptoms. ---Caller states he works in [employer], and he has seasonal allergies. States he has a small cough but the site where he is working is very strict about people working that are showing any symptoms of COVID-19. Has the patient had close contact with a person known or suspected to have the novel coronavirus illness OR traveled / lives in area with major community spread (including international travel) in the last 14 days from the onset of symptoms? * If Asymptomatic, screen for exposure and travel within the last 14 days. ---No Does the patient have any new or worsening symptoms? ---Yes Will a triage be completed? ---Yes Related visit to physician within the last 2 weeks? ---No Does the PT have any chronic conditions? (i.e. diabetes, asthma, this includes High risk factors for pregnancy, etc.) ---No Is this a behavioral health or  substance abuse call? ---No Guidelines Guideline Title Affirmed Question Affirmed Notes Nurse Date/Time (Eastern Time) Coronavirus (COVID-19) - Exposure [1] Fever (or feeling feverish) OR cough AND [2] within 14 Days of COVID-19 EXPOSURE (Close Contact) Newhart, RN, Beth 03/09/2019 5:12:36 PM PLEASE NOTE: All timestamps contained within this report are represented as [place] Standard Time. CONFIDENTIALTY NOTICE: This fax transmission is intended only for the addressee. It contains information that is legally privileged, confidential or otherwise protected from use or disclosure. If you are not the intended recipient, you are strictly prohibited from reviewing, disclosing, copying using or disseminating any of this information or taking any action in reliance on or regarding this information. If you have received this fax in error, please notify us immediately by telephone so that we can arrange for its return to us. Phone: [phone], Toll-Free: [phone], Fax: [phone] Page: 2 of 2 Call Id: [id number] Disp. Time Lamount Cohen(Eastern Time) Disposition Final User 03/09/2019 5:16:54 PM Call PCP within 24 Hours Yes Newhart, RN, Waynetta SandyBeth Caller Disagree/Comply Comply Caller Understands Yes PreDisposition Call another nurse Care Advice Given Per Guideline CALL PCP WITHIN 24 HOURS: * IF OFFICE WILL BE OPEN: Call the office when it opens tomorrow morning. * You need to discuss this with your doctor (or NP/PA) within the next 24 hours. * Telemedicine may be your best choice for care during this COVID-19 outbreak. ALTERNATE DISPOSITION - CALL TELEMEDICINE PROVIDER WITHIN 24 HOURS: NOTE TO TRIAGER - COVID-19 TESTING PROBABLY INDICATED: * The patient has a cough and/or fever and a possible exposure to COVID-19. TESTING OR IN-PERSON VISITS - PATIENT MUST TELL HEALTHCARE PERSONNEL: * Tell the first healthcare worker you meet that you may have been exposed to COVID-19. HOME  ISOLATION IS NEEDED: * STAY AT  HOME. Home isolation is needed to prevent the spread of infection to others. * Do NOT allow any visitors * Do NOT go to work or school * Do NOT go to church, child care centers, shopping, restaurants, or other public places. COVER YOUR MOUTH AND NOSE, WEAR A MASK: * Cover your mouth and nose with a disposable tissue (e.g., Kleenex, toilet paper, paper towel) or washcloth. * HOME REMEDY - HONEY: This old home remedy has been shown to help decrease coughing at night. The adult dosage is 2 teaspoons (10 ml) at bedtime. Honey should not be given to infants under one year of age. CALL BACK IF: * Shortness of breath or trouble breathing * You become worse. CARE ADVICE given per Coronavirus (COVID-19) - Exposure (Adult) guideline. Referrals REFERRED TO PCP OFFICE REFERRED TO PCP OFFICE

## 2019-03-10 NOTE — Assessment & Plan Note (Signed)
Anticipate allergic cause vs viral. There appears to be a L tonsillolith present as well. Supportive care reviewed. Rx low dose prednisone taper for allergic symptoms, supportive care reviewed including allergen avoidance. Doubt Covid19. Will keep out of work for next 2 days with return to work on Monday. Red flags to notify us or seek further care reviewed. Pt agrees with plan.

## 2019-03-10 NOTE — Telephone Encounter (Signed)
Pt already scheduled virtual appt on 03/10/19 at 12 noon. FYI to Dr Reece Agar.

## 2019-04-15 ENCOUNTER — Encounter: Payer: Self-pay | Admitting: Family Medicine

## 2019-04-15 ENCOUNTER — Telehealth (INDEPENDENT_AMBULATORY_CARE_PROVIDER_SITE_OTHER): Payer: BLUE CROSS/BLUE SHIELD | Admitting: Family Medicine

## 2019-04-15 VITALS — Ht 63.0 in

## 2019-04-15 DIAGNOSIS — M545 Low back pain, unspecified: Secondary | ICD-10-CM | POA: Insufficient documentation

## 2019-04-15 LAB — POC URINALSYSI DIPSTICK (AUTOMATED)
Bilirubin, UA: NEGATIVE
Blood, UA: NEGATIVE
Glucose, UA: NEGATIVE
Ketones, UA: NEGATIVE
Leukocytes, UA: NEGATIVE
Nitrite, UA: NEGATIVE
Protein, UA: NEGATIVE
Spec Grav, UA: 1.025 (ref 1.010–1.025)
Urobilinogen, UA: 0.2 E.U./dL
pH, UA: 6 (ref 5.0–8.0)

## 2019-04-15 MED ORDER — CYCLOBENZAPRINE HCL 10 MG PO TABS: 5.0000 mg | ORAL_TABLET | Freq: Two times a day (BID) | ORAL | 0 refills | Status: DC | PRN

## 2019-04-15 NOTE — Telephone Encounter (Signed)
Spoke with pt asking for details of sxs.  C/o constant low back pain.  Worse when getting out of bed in mornings. Started about 1 wk ago. Had some diarrhea on 5/18 & 5/19.   Will drop off urine sample within the hour.

## 2019-04-15 NOTE — Assessment & Plan Note (Signed)
Story most consistent with lumbar strain. Supportive care reviewed. Rest over weekend, continue NSAID, add flexeril muscle relaxant with sedation precautions, ice/heat. Update if not improving with treatment. Pt agrees with plan.

## 2019-04-15 NOTE — Telephone Encounter (Signed)
Pt scheduled for MyChart visit 3:30pm

## 2019-04-15 NOTE — Progress Notes (Signed)
Virtual visit completed through MyChart. Due to national recommendations of social distancing due to COVID-19, a virtual visit is felt to be most appropriate for this patient at this time.   Patient location: home Provider location: [place] at [facility], office If any vitals were documented, they were collected by patient at home unless specified below.    Ht 5\' 3"  (1.6 m)   BMI 31.89 kg/m    CC: lower back pain Subjective:    Patient ID: Shane Garza, male    DOB: 1991/06/04, 28 y.o.   MRN: [id number]  HPI: Shane Garza is a 28 y.o. male presenting on 04/15/2019 for Back Pain (C/o bilateral low back pain, worse getting out of bed in the mornings. Occasionally has some lower abd discomfort. Started about 1 wk ago.  Also reports having diarrhea on 5/18 and 5/19. )   1 wk h/o lower back pain that started after moving furniture the other weekend. Band like pain across lower back. Throbbing ache. Worse when he gets up in the morning. Had diarrhea earlier in the week - now resolved.   Treating with ibuprofen 600mg  BID with mild benefit.  No fevers/chills, shooting pain down legs, numbness/weakness of legs, bowel/bladder incontinence.  No dysuria, urgency.   Took wife to [facility] this week - she was diagnosed with kidney infection.       Relevant past medical, surgical, family and social history reviewed and updated as indicated. Interim medical history since our last visit reviewed. Allergies and medications reviewed and updated. Outpatient Medications Prior to Visit  Medication Sig Dispense Refill  . aspirin-acetaminophen-caffeine (EXCEDRIN MIGRAINE) 250-250-65 MG per tablet Take 1 tablet by mouth every 6 (six) hours as needed.      Marland Kitchen esomeprazole (NEXIUM) 20 MG capsule Take 20 mg by mouth daily at 12 noon.    Marland Kitchen levocetirizine (XYZAL) 5 MG tablet Take 5 mg by mouth every evening.    . Multiple Vitamin (MULTIVITAMIN) capsule Take by mouth daily.    . Multiple  Vitamins-Minerals (MULTIVITAMIN ADULT PO) Take 1 tablet by mouth daily.    . predniSONE (DELTASONE) 10 MG tablet Take three tablets for 2 days followed by two tablets for 2 days followed by one tablet for 2 days 12 tablet 0  . Probiotic Product (PROBIOTIC DAILY PO) Take by mouth daily.    Marland Kitchen triamcinolone (NASACORT ALLERGY 24HR) 55 MCG/ACT AERO nasal inhaler Place 1 spray into the nose daily.     No facility-administered medications prior to visit.      Per HPI unless specifically indicated in ROS section below Review of Systems Objective:    Ht 5\' 3"  (1.6 m)   BMI 31.89 kg/m   Wt Readings from Last 3 Encounters:  03/10/19 180 lb (81.6 kg)  12/06/18 190 lb 12 oz (86.5 kg)  06/08/18 182 lb 8 oz (82.8 kg)     Physical exam: Gen: alert, NAD, not ill appearing Pulm: speaks in complete sentences without increased work of breathing Psych: normal mood, normal thought content      Results for orders placed or performed in visit on 04/15/19  POCT Urinalysis Dipstick (Automated)  Result Value Ref Range   Color, UA yellow    Clarity, UA clear    Glucose, UA Negative Negative   Bilirubin, UA negative    Ketones, UA negative    Spec Grav, UA 1.025 1.010 - 1.025   Blood, UA negative    pH, UA 6.0 5.0 - 8.0  Protein, UA Negative Negative   Urobilinogen, UA 0.2 0.2 or 1.0 E.U./dL   Nitrite, UA negative    Leukocytes, UA Negative Negative   Assessment & Plan:   Problem List Items Addressed This Visit    Acute bilateral low back pain without sciatica - Primary    Story most consistent with lumbar strain. Supportive care reviewed. Rest over weekend, continue NSAID, add flexeril muscle relaxant with sedation precautions, ice/heat. Update if not improving with treatment. Pt agrees with plan.       Relevant Medications   cyclobenzaprine (FLEXERIL) 10 MG tablet   Other Relevant Orders   POCT Urinalysis Dipstick (Automated) (Completed)       Meds ordered this encounter  Medications   . cyclobenzaprine (FLEXERIL) 10 MG tablet    Sig: Take 0.5-1 tablets (5-10 mg total) by mouth 2 (two) times daily as needed for muscle spasms (sedation precautions).    Dispense:  30 tablet    Refill:  0   Orders Placed This Encounter  Procedures  . POCT Urinalysis Dipstick (Automated)    Follow up plan: Return if symptoms worsen or fail to improve.  Eustaquio BoydenJavier Brendon Christoffel, MD

## 2019-04-15 NOTE — Telephone Encounter (Signed)
Will you schedule virtual visit for today at 3:30 for low back pain?

## 2019-06-14 ENCOUNTER — Ambulatory Visit (INDEPENDENT_AMBULATORY_CARE_PROVIDER_SITE_OTHER): Payer: BC Managed Care – PPO | Admitting: Family Medicine

## 2019-06-14 ENCOUNTER — Encounter: Payer: Self-pay | Admitting: Family Medicine

## 2019-06-14 ENCOUNTER — Other Ambulatory Visit: Payer: Self-pay

## 2019-06-14 DIAGNOSIS — J301 Allergic rhinitis due to pollen: Secondary | ICD-10-CM

## 2019-06-14 DIAGNOSIS — J22 Unspecified acute lower respiratory infection: Secondary | ICD-10-CM | POA: Diagnosis not present

## 2019-06-14 DIAGNOSIS — J029 Acute pharyngitis, unspecified: Secondary | ICD-10-CM | POA: Diagnosis not present

## 2019-06-14 NOTE — Assessment & Plan Note (Signed)
Anticipate viral URI - in setting of Covid pandemic and newborn at home, reasonable to send for testing. He will go get tested today. Covid ordered. Out of work until feeling better.

## 2019-06-14 NOTE — Assessment & Plan Note (Signed)
Continue xyzal and nasacort.

## 2019-06-14 NOTE — Progress Notes (Signed)
Virtual visit completed through [employer]. Due to national recommendations of social distancing due to COVID-19, a virtual visit is felt to be most appropriate for this patient at this time. Reviewed limitations of a virtual visit.   Patient location: home Provider location: [place] at [facility], office If any vitals were documented, they were collected by patient at home unless specified below.    There were no vitals taken for this visit.   CC: cough Subjective:    Patient ID: Shane SloughFranklin R Porcaro, male    DOB: 06/30/1991, 28 y.o.   MRN: [id number]  HPI: Shane Garza is a 28 y.o. male presenting on 06/14/2019 for Cough (Doxy appt)   3-4d h/o scratchy throat, dry cough, post nasal drainage. Significant cough last night, worse when supine. Stays fatigued. Mild stuffy nose and sneezing. + HA.   No fever, dyspnea, myalgias, abd pain, diarrhea, nausea.   Has been working outdoors through weekend. Mildly dehydrated.  Known perennial allergic rhinitis worse in summer and spring. Doesn't know triggers.  No known sick contacts. No known Covid exposure. 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   Continues regular xyzal, nasacort.   Newborn at home     Relevant past medical, surgical, family and social history reviewed and updated as indicated. Interim medical history since our last visit reviewed. Allergies and medications reviewed and updated. Outpatient Medications Prior to Visit  Medication Sig Dispense Refill  . aspirin-acetaminophen-caffeine (EXCEDRIN MIGRAINE) 250-250-65 MG per tablet Take 1 tablet by mouth every 6 (six) hours as needed.      Marland Kitchen. esomeprazole (NEXIUM) 20 MG capsule Take 20 mg by mouth daily at 12 noon.    Marland Kitchen. levocetirizine (XYZAL) 5 MG tablet Take 5 mg by mouth every evening.    . Multiple Vitamin (MULTIVITAMIN) capsule Take by mouth daily.    . Multiple Vitamins-Minerals (MULTIVITAMIN ADULT PO) Take 1 tablet by mouth daily.    .  Probiotic Product (PROBIOTIC DAILY PO) Take by mouth daily.    Marland Kitchen. triamcinolone (NASACORT ALLERGY 24HR) 55 MCG/ACT AERO nasal inhaler Place 1 spray into the nose daily.    . cyclobenzaprine (FLEXERIL) 10 MG tablet Take 0.5-1 tablets (5-10 mg total) by mouth 2 (two) times daily as needed for muscle spasms (sedation precautions). 30 tablet 0  . predniSONE (DELTASONE) 10 MG tablet Take three tablets for 2 days followed by two tablets for 2 days followed by one tablet for 2 days 12 tablet 0   No facility-administered medications prior to visit.      Per HPI unless specifically indicated in ROS section below Review of Systems Objective:    There were no vitals taken for this visit.  Wt Readings from Last 3 Encounters:  03/10/19 180 lb (81.6 kg)  12/06/18 190 lb 12 oz (86.5 kg)  06/08/18 182 lb 8 oz (82.8 kg)     Physical exam: Gen: alert, NAD, not ill appearing Pulm: speaks in complete sentences without increased work of breathing Psych: normal mood, normal thought content      Assessment & Plan:   Problem List Items Addressed This Visit    Sore throat   ALLERGIC RHINITIS, SEASONAL    Continue xyzal and nasacort.       Acute respiratory infection - Primary    Anticipate viral URI - in setting of Covid pandemic and newborn at home, reasonable to send for testing. He will go get tested today. Covid ordered. Out of work until feeling better.  Relevant Orders   Novel Coronavirus, NAA ([facility])       No orders of the defined types were placed in this encounter.  Orders Placed This Encounter  Procedures  . Novel Coronavirus, NAA ([facility])    Order Specific Question:   Known Exposure    Answer:   No    I discussed the assessment and treatment plan with the patient. The patient was provided an opportunity to ask questions and all were answered. The patient agreed with the plan and demonstrated an understanding of the instructions. The patient was advised to call back or seek  an in-person evaluation if the symptoms worsen or if the condition fails to improve as anticipated.  Follow up plan: No follow-ups on file.  Ria Bush, MD

## 2019-06-15 ENCOUNTER — Encounter: Payer: Self-pay | Admitting: Family Medicine

## 2019-06-16 ENCOUNTER — Encounter: Payer: Self-pay | Admitting: Family Medicine

## 2019-06-16 LAB — NOVEL CORONAVIRUS, NAA: SARS-CoV-2, NAA: NOT DETECTED

## 2019-10-25 DIAGNOSIS — U071 COVID-19: Secondary | ICD-10-CM

## 2019-10-25 HISTORY — DX: COVID-19: U07.1

## 2019-11-07 ENCOUNTER — Encounter: Payer: Self-pay | Admitting: Family Medicine

## 2019-11-07 NOTE — Telephone Encounter (Signed)
Close contact with positive COVID coworker.  Told by job to be tested.  Pt was already having sxs (runny nose, nasal congestion, loss of sense of taste/smell and chest tightness) on 10/25/19.    Tested at [employer] on 10/27/19.  Positive results on 11/01/19.  Quarantined since 10/27/19.  States sxs are improving but still has congestion, smell/taste is coming back and has mild fatigue.  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  However, pt wants to wait until 11/14/19 to return to work since h/o RSV, pneumonia and lung issues.  Pls advise.  [Gives permission to lvm.]

## 2020-01-30 ENCOUNTER — Encounter: Payer: Self-pay | Admitting: Family Medicine

## 2020-01-30 ENCOUNTER — Other Ambulatory Visit: Payer: Self-pay

## 2020-01-30 ENCOUNTER — Ambulatory Visit (INDEPENDENT_AMBULATORY_CARE_PROVIDER_SITE_OTHER): Payer: BC Managed Care – PPO | Admitting: Family Medicine

## 2020-01-30 VITALS — BP 137/76 | HR 80 | Temp 98.3°F | Ht 63.0 in | Wt 177.0 lb

## 2020-01-30 DIAGNOSIS — J019 Acute sinusitis, unspecified: Secondary | ICD-10-CM | POA: Insufficient documentation

## 2020-01-30 DIAGNOSIS — J4521 Mild intermittent asthma with (acute) exacerbation: Secondary | ICD-10-CM | POA: Diagnosis not present

## 2020-01-30 DIAGNOSIS — J45901 Unspecified asthma with (acute) exacerbation: Secondary | ICD-10-CM | POA: Insufficient documentation

## 2020-01-30 MED ORDER — ALBUTEROL SULFATE HFA 108 (90 BASE) MCG/ACT IN AERS: 2.0000 | INHALATION_SPRAY | Freq: Four times a day (QID) | RESPIRATORY_TRACT | 1 refills | Status: AC | PRN

## 2020-01-30 MED ORDER — AZITHROMYCIN 250 MG PO TABS: ORAL_TABLET | ORAL | 0 refills | Status: DC

## 2020-01-30 MED ORDER — PREDNISONE 20 MG PO TABS: ORAL_TABLET | ORAL | 0 refills | Status: DC

## 2020-01-30 NOTE — Assessment & Plan Note (Signed)
Anticipate upper respiratory/sinus infection flaring reactive airways causing his dyspnea in h/o childhood asthma and recent Covid-19 illness. rec start prednisone taper and albuterol inhaler PRN. Provided WASP for azithromycin with indications when to fill (fever, worsening purulent sputum or nasal drainage, worsening cough).  He had covid illness >3 months ago - discussed reasonable to send for testing for work however I don't think he has recurrent covid. Provided with [email or web address] website where he may sign up for this.

## 2020-01-30 NOTE — Progress Notes (Signed)
Virtual visit completed through [place]. Due to national recommendations of social distancing due to COVID-19, a virtual visit is felt to be most appropriate for this patient at this time. Reviewed limitations of a virtual visit.   Patient location: home Provider location: [place] at [place], office If any vitals were documented, they were collected by patient at home unless specified below.    BP 137/76   Pulse 80   Temp 98.3 F (36.8 C)   Ht 5\' 3"  (1.6 m)   Wt 177 lb (80.3 kg)   BMI 31.35 kg/m    CC: sinus trouble Subjective:    Patient ID: Shane Garza, male    DOB: [birth year], 29 y.o.   MRN: [id number]  HPI: Shane Garza is a 29 y.o. male presenting on 01/30/2020 for Sinus Problem (C/o runny nose, nasal congestion, HA, fatigue and some chest tightness.  Sxs started 01/27/20.  H/o COVID in 10/2019. )   Had Covid-19 illness 10/2019. Did fully recover.  Grandfather passed away from Covid last month - funeral early Feb - he was very involved in this process.  2021 - overall healthy changes - has stopped drinking.   4d h/o rhinorrhea (clear), congestion, 2d ago started noticing chest tightness. Sinus pressure pain and fatigue. + ST, PNdrainage.  Went to work today - was unable to tolerating wearing a mask - felt like he was suffocating - so he removed it - had to come home due to not being able to wear mask at home.   No fever, wheezing or cough. No ear or tooth pain.   Managing with pseudophed with benefit.   H/o childhood asthma - largely grew out of this.  Seasonal allergies - usually tend to flare this time of year.        Relevant past medical, surgical, family and social history reviewed and updated as indicated. Interim medical history since our last visit reviewed. Allergies and medications reviewed and updated. Outpatient Medications Prior to Visit  Medication Sig Dispense Refill  . aspirin-acetaminophen-caffeine (EXCEDRIN MIGRAINE) 250-250-65 MG per  tablet Take 1 tablet by mouth every 6 (six) hours as needed.      Marland Kitchen esomeprazole (NEXIUM) 20 MG capsule Take 20 mg by mouth daily at 12 noon.    Marland Kitchen levocetirizine (XYZAL) 5 MG tablet Take 5 mg by mouth every evening.    . Multiple Vitamins-Minerals (MULTIVITAMIN ADULT PO) Take 3 tablets by mouth daily.     . Nutritional Supplements (IMMUNE ENHANCE PO) Take by mouth daily.    . Omega-3 Fatty Acids (OMEGA 3 PO) Take 2 capsules by mouth daily. 1280 mg total    . Probiotic Product (PROBIOTIC DAILY PO) Take by mouth daily.    . Multiple Vitamin (MULTIVITAMIN) capsule Take by mouth daily.    Marland Kitchen triamcinolone (NASACORT ALLERGY 24HR) 55 MCG/ACT AERO nasal inhaler Place 1 spray into the nose daily.     No facility-administered medications prior to visit.     Per HPI unless specifically indicated in ROS section below Review of Systems Objective:    BP 137/76   Pulse 80   Temp 98.3 F (36.8 C)   Ht 5\' 3"  (1.6 m)   Wt 177 lb (80.3 kg)   BMI 31.35 kg/m   Wt Readings from Last 3 Encounters:  01/30/20 177 lb (80.3 kg)  03/10/19 180 lb (81.6 kg)  12/06/18 190 lb 12 oz (86.5 kg)     Physical exam: Gen: alert, NAD, not ill appearing  Pulm: speaks in complete sentences without increased work of breathing. No cyanosis noted.  Psych: normal mood, normal thought content      Results for orders placed or performed in visit on 06/14/19  Novel Coronavirus, NAA ([facility])  Result Value Ref Range   SARS-CoV-2, NAA Not Detected Not Detected   Assessment & Plan:   Problem List Items Addressed This Visit    Reactive airway disease with acute exacerbation    Anticipate upper respiratory/sinus infection flaring reactive airways causing his dyspnea in h/o childhood asthma and recent Covid-19 illness. rec start prednisone taper and albuterol inhaler PRN. Provided WASP for azithromycin with indications when to fill (fever, worsening purulent sputum or nasal drainage, worsening cough).  He had covid illness  >3 months ago - discussed reasonable to send for testing for work however I don't think he has recurrent covid. Provided with [email or web address] website where he may sign up for this.       Acute sinusitis - Primary    Anticipate viral given short duration. Supportive care reviewed.       Relevant Medications   predniSONE (DELTASONE) 20 MG tablet   azithromycin (ZITHROMAX) 250 MG tablet       Meds ordered this encounter  Medications  . albuterol (VENTOLIN HFA) 108 (90 Base) MCG/ACT inhaler    Sig: Inhale 2 puffs into the lungs every 6 (six) hours as needed for wheezing or shortness of breath.    Dispense:  18 g    Refill:  1  . predniSONE (DELTASONE) 20 MG tablet    Sig: Take two tablets daily for 3 days followed by one tablet daily for 4 days    Dispense:  10 tablet    Refill:  0  . azithromycin (ZITHROMAX) 250 MG tablet    Sig: Take two tablets on day one followed by one tablet on days 2-5    Dispense:  6 each    Refill:  0   No orders of the defined types were placed in this encounter.   I discussed the assessment and treatment plan with the patient. The patient was provided an opportunity to ask questions and all were answered. The patient agreed with the plan and demonstrated an understanding of the instructions. The patient was advised to call back or seek an in-person evaluation if the symptoms worsen or if the condition fails to improve as anticipated.  Follow up plan: Return if symptoms worsen or fail to improve.  Eustaquio Boyden, MD

## 2020-01-30 NOTE — Assessment & Plan Note (Signed)
Anticipate viral given short duration.  Supportive care reviewed.  

## 2020-05-16 ENCOUNTER — Ambulatory Visit: Payer: BC Managed Care – PPO | Admitting: Family Medicine

## 2020-05-16 ENCOUNTER — Encounter: Payer: Self-pay | Admitting: Family Medicine

## 2020-05-16 ENCOUNTER — Other Ambulatory Visit: Payer: Self-pay

## 2020-05-16 VITALS — BP 116/78 | HR 88 | Temp 97.9°F | Ht 63.0 in | Wt 178.2 lb

## 2020-05-16 DIAGNOSIS — R300 Dysuria: Secondary | ICD-10-CM | POA: Insufficient documentation

## 2020-05-16 LAB — POC URINALSYSI DIPSTICK (AUTOMATED)
Blood, UA: NEGATIVE
Glucose, UA: NEGATIVE
Ketones, UA: NEGATIVE
Leukocytes, UA: NEGATIVE
Nitrite, UA: NEGATIVE
Protein, UA: NEGATIVE
Spec Grav, UA: >=1.03 — AB (ref 1.010–1.025)
Urobilinogen, UA: 0.2 E.U./dL
pH, UA: 5.5 (ref 5.0–8.0)

## 2020-05-16 MED ORDER — SULFAMETHOXAZOLE-TRIMETHOPRIM 800-160 MG PO TABS: 1.0000 | ORAL_TABLET | Freq: Two times a day (BID) | ORAL | 0 refills | Status: DC

## 2020-05-16 NOTE — Progress Notes (Signed)
This visit was conducted in person.  BP 116/78 (BP Location: Left Arm, Patient Position: Sitting, Cuff Size: Normal)   Pulse 88   Temp 97.9 F (36.6 C) (Temporal)   Ht 5\' 3"  (1.6 m)   Wt 178 lb 4 oz (80.9 kg)   SpO2 98%   BMI 31.58 kg/m    CC: back pain Subjective:    Patient ID: , male    DOB: January 02, 1991, 29 y.o.   MRN: [id number]  HPI: Shane Garza is a 29 y.o. male presenting on 05/16/2020 for Back Pain (C/o low back pain on both sides.  Also, c/o burning when urinating, nausea and bilateral testicle discomfort.  Started about 3 wks ago. H/o epididmytis. )   Grandfather passed away this month.   3 wk h/o bilateral lower back pain associated with dysuria, nausea and lower abd discomfort. Also notes intermittent bilateral testicular discomfort for 3+ weeks described as dull aching pain - kinda feels like prior epididymitis. Notes weakening of stream as well.  Worse when standing or supine, better when he leans forward.   No fevers/chills, vomiting, diarrhea/constipation or blood in stool. No increased urgency or frequency or hematuria.  No new groin rash, discharge, no new sexual partners.   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 Has been pushing fluids.      Relevant past medical, surgical, family and social history reviewed and updated as indicated. Interim medical history since our last visit reviewed. Allergies and medications reviewed and updated. Outpatient Medications Prior to Visit  Medication Sig Dispense Refill  . albuterol (VENTOLIN HFA) 108 (90 Base) MCG/ACT inhaler Inhale 2 puffs into the lungs every 6 (six) hours as needed for wheezing or shortness of breath. 18 g 1  . aspirin-acetaminophen-caffeine (EXCEDRIN MIGRAINE) 250-250-65 MG per tablet Take 1 tablet by mouth every 6 (six) hours as needed.      05/18/2020 esomeprazole (NEXIUM) 20 MG capsule Take 20 mg by mouth daily at 12 noon.    Marland Kitchen  levocetirizine (XYZAL) 5 MG tablet Take 5 mg by mouth every evening.    . Multiple Vitamins-Minerals (MULTIVITAMIN ADULT PO) Take 3 tablets by mouth daily.     . Nutritional Supplements (IMMUNE ENHANCE PO) Take by mouth daily.    . Omega-3 Fatty Acids (OMEGA 3 PO) Take 2 capsules by mouth daily. 1280 mg total    . Probiotic Product (PROBIOTIC DAILY PO) Take by mouth daily.    Marland Kitchen azithromycin (ZITHROMAX) 250 MG tablet Take two tablets on day one followed by one tablet on days 2-5 6 each 0  . predniSONE (DELTASONE) 20 MG tablet Take two tablets daily for 3 days followed by one tablet daily for 4 days 10 tablet 0   No facility-administered medications prior to visit.     Per HPI unless specifically indicated in ROS section below Review of Systems Objective:  BP 116/78 (BP Location: Left Arm, Patient Position: Sitting, Cuff Size: Normal)   Pulse 88   Temp 97.9 F (36.6 C) (Temporal)   Ht 5\' 3"  (1.6 m)   Wt 178 lb 4 oz (80.9 kg)   SpO2 98%   BMI 31.58 kg/m   Wt Readings from Last 3 Encounters:  05/16/20 178 lb 4 oz (80.9 kg)  01/30/20 177 lb (80.3 kg)  03/10/19 180 lb (81.6 kg)      Physical Exam Vitals and nursing note reviewed.  Constitutional:      Appearance:  Normal appearance. He is not ill-appearing.  Cardiovascular:     Rate and Rhythm: Normal rate and regular rhythm.     Pulses: Normal pulses.     Heart sounds: No murmur heard.   Pulmonary:     Effort: Pulmonary effort is normal. No respiratory distress.     Breath sounds: Normal breath sounds. No wheezing, rhonchi or rales.  Abdominal:     General: Abdomen is flat. Bowel sounds are increased. There is no distension.     Palpations: Abdomen is soft. There is no mass.     Tenderness: There is abdominal tenderness (mild) in the right lower quadrant, suprapubic area and left lower quadrant. There is no right CVA tenderness, left CVA tenderness, guarding or rebound. Negative signs include Murphy's sign.     Hernia: No  hernia is present. There is no hernia in the left inguinal area or right inguinal area.  Genitourinary:    Pubic Area: No rash.      Penis: Normal.      Testes: Normal.        Right: Mass, tenderness or swelling not present.        Left: Mass, tenderness or swelling not present.     Epididymis:     Right: Normal.     Left: Normal.  Musculoskeletal:        General: Normal range of motion.     Right lower leg: No edema.     Left lower leg: No edema.     Comments:  Mild discomfort mid lower lumbar spine Discomfort to palpation R lumbar paraspinous mm  Neg SLR bilaterally. No pain with int/ext rotation at hip. Neg FABER. No pain at SIJ, GTB or sciatic notch bilaterally.   Lymphadenopathy:     Lower Body: No right inguinal adenopathy. No left inguinal adenopathy.  Skin:    General: Skin is warm and dry.     Findings: No rash.  Neurological:     General: No focal deficit present.     Mental Status: He is alert.     Comments: 5/5 strength BLE  Psychiatric:        Mood and Affect: Mood normal.        Behavior: Behavior normal.       Results for orders placed or performed in visit on 05/16/20  POCT Urinalysis Dipstick (Automated)  Result Value Ref Range   Color, UA dark yellow    Clarity, UA clear    Glucose, UA Negative Negative   Bilirubin, UA 1+    Ketones, UA negative    Spec Grav, UA >=1.030 (A) 1.010 - 1.025   Blood, UA negative    pH, UA 5.5 5.0 - 8.0   Protein, UA Negative Negative   Urobilinogen, UA 0.2 0.2 or 1.0 E.U./dL   Nitrite, UA negative    Leukocytes, UA Negative Negative   Assessment & Plan:  This visit occurred during the SARS-CoV-2 public health emergency.  Safety protocols were in place, including screening questions prior to the visit, additional usage of staff PPE, and extensive cleaning of exam room while observing appropriate contact time as indicated for disinfecting solutions.   Problem List Items Addressed This Visit    Dysuria - Primary     Several weeks of dysuria, nausea, lower abd and back discomfort, as well as testicular discomfort and weakened urinary stream. Exam not consistent with epididymo orchitis or abdominal pathology. Anticipate subacute prostatitis - will treat accordingly with 2 wk bactrim course. Also  rec push fluids, limit bladder irritants. UA without blood but concentrated. UCx sent. Possible mechanical lumbar back pain component.       Relevant Orders   POCT Urinalysis Dipstick (Automated) (Completed)   Urine Culture       Meds ordered this encounter  Medications  . sulfamethoxazole-trimethoprim (BACTRIM DS) 800-160 MG tablet    Sig: Take 1 tablet by mouth 2 (two) times daily.    Dispense:  28 tablet    Refill:  0   Orders Placed This Encounter  Procedures  . Urine Culture  . POCT Urinalysis Dipstick (Automated)    Patient Instructions  Urine concentrated but overall ok - increase water intake to avoid dehydration especially in work setting.  Limit bladder irritants like dark sodas, spicy foods, caffeine.  Possible prostate infection - treat with 2 week bactrim course. Return if not fully better with this.    Follow up plan: Return if symptoms worsen or fail to improve.  Ria Bush, MD

## 2020-05-16 NOTE — Assessment & Plan Note (Signed)
Several weeks of dysuria, nausea, lower abd and back discomfort, as well as testicular discomfort and weakened urinary stream. Exam not consistent with epididymo orchitis or abdominal pathology. Anticipate subacute prostatitis - will treat accordingly with 2 wk bactrim course. Also rec push fluids, limit bladder irritants. UA without blood but concentrated. UCx sent. Possible mechanical lumbar back pain component.

## 2020-05-16 NOTE — Patient Instructions (Addendum)
Urine concentrated but overall ok - increase water intake to avoid dehydration especially in work setting.  Limit bladder irritants like dark sodas, spicy foods, caffeine.  Possible prostate infection - treat with 2 week bactrim course. Return if not fully better with this.

## 2020-05-17 LAB — URINE CULTURE
MICRO NUMBER:: 10625289
Result:: NO GROWTH
SPECIMEN QUALITY:: ADEQUATE

## 2020-05-30 ENCOUNTER — Encounter: Payer: Self-pay | Admitting: Family Medicine

## 2020-06-01 MED ORDER — SULFAMETHOXAZOLE-TRIMETHOPRIM 800-160 MG PO TABS: 1.0000 | ORAL_TABLET | Freq: Two times a day (BID) | ORAL | 0 refills | Status: DC

## 2020-06-16 ENCOUNTER — Encounter: Payer: Self-pay | Admitting: Family Medicine

## 2020-06-19 MED ORDER — SULFAMETHOXAZOLE-TRIMETHOPRIM 800-160 MG PO TABS: 1.0000 | ORAL_TABLET | Freq: Two times a day (BID) | ORAL | 0 refills | Status: AC

## 2020-06-29 ENCOUNTER — Encounter: Payer: Self-pay | Admitting: Family Medicine

## 2020-08-27 DIAGNOSIS — Z20822 Contact with and (suspected) exposure to covid-19: Secondary | ICD-10-CM | POA: Diagnosis not present

## 2020-08-27 DIAGNOSIS — J069 Acute upper respiratory infection, unspecified: Secondary | ICD-10-CM | POA: Diagnosis not present

## 2020-09-13 DIAGNOSIS — Z0000 Encounter for general adult medical examination without abnormal findings: Secondary | ICD-10-CM | POA: Diagnosis not present

## 2020-09-13 DIAGNOSIS — R946 Abnormal results of thyroid function studies: Secondary | ICD-10-CM | POA: Diagnosis not present

## 2020-09-13 DIAGNOSIS — Z87438 Personal history of other diseases of male genital organs: Secondary | ICD-10-CM | POA: Diagnosis not present

## 2020-09-13 DIAGNOSIS — Z6829 Body mass index (BMI) 29.0-29.9, adult: Secondary | ICD-10-CM | POA: Diagnosis not present

## 2020-11-19 DIAGNOSIS — R0789 Other chest pain: Secondary | ICD-10-CM | POA: Diagnosis not present

## 2020-11-19 DIAGNOSIS — Z6829 Body mass index (BMI) 29.0-29.9, adult: Secondary | ICD-10-CM | POA: Diagnosis not present

## 2020-12-03 DIAGNOSIS — R1310 Dysphagia, unspecified: Secondary | ICD-10-CM | POA: Diagnosis not present

## 2020-12-03 DIAGNOSIS — K219 Gastro-esophageal reflux disease without esophagitis: Secondary | ICD-10-CM | POA: Diagnosis not present

## 2020-12-03 DIAGNOSIS — F17209 Nicotine dependence, unspecified, with unspecified nicotine-induced disorders: Secondary | ICD-10-CM | POA: Diagnosis not present

## 2020-12-03 DIAGNOSIS — Z6830 Body mass index (BMI) 30.0-30.9, adult: Secondary | ICD-10-CM | POA: Diagnosis not present
